# Patient Record
Sex: Female | Born: 1980 | Race: Black or African American | Hispanic: No | Marital: Single | Smoking: Never smoker
Health system: Southern US, Community
[De-identification: ages and names within clinical notes are randomized; demographics above are authoritative.]

## PROBLEM LIST (undated history)

## (undated) DIAGNOSIS — L732 Hidradenitis suppurativa: Secondary | ICD-10-CM

## (undated) DIAGNOSIS — D509 Iron deficiency anemia, unspecified: Secondary | ICD-10-CM

## (undated) DIAGNOSIS — Z8709 Personal history of other diseases of the respiratory system: Secondary | ICD-10-CM

## (undated) HISTORY — DX: Iron deficiency anemia, unspecified: D50.9

## (undated) HISTORY — DX: Personal history of other diseases of the respiratory system: Z87.09

## (undated) HISTORY — PX: SKIN SURGERY: SHX2413

## (undated) HISTORY — PX: UTERINE FIBROID SURGERY: SHX826

---

## 1998-01-30 ENCOUNTER — Encounter: Payer: Self-pay | Admitting: Emergency Medicine

## 1998-01-30 ENCOUNTER — Emergency Department (HOSPITAL_COMMUNITY): Admission: EM | Admit: 1998-01-30 | Discharge: 1998-01-30 | Payer: Self-pay | Admitting: Emergency Medicine

## 1999-08-26 ENCOUNTER — Emergency Department (HOSPITAL_COMMUNITY): Admission: EM | Admit: 1999-08-26 | Discharge: 1999-08-26 | Payer: Self-pay | Admitting: Emergency Medicine

## 2000-06-29 ENCOUNTER — Emergency Department (HOSPITAL_COMMUNITY): Admission: EM | Admit: 2000-06-29 | Discharge: 2000-06-29 | Payer: Self-pay | Admitting: Emergency Medicine

## 2000-12-06 ENCOUNTER — Emergency Department (HOSPITAL_COMMUNITY): Admission: EM | Admit: 2000-12-06 | Discharge: 2000-12-06 | Payer: Self-pay | Admitting: Emergency Medicine

## 2002-01-04 ENCOUNTER — Encounter: Payer: Self-pay | Admitting: Emergency Medicine

## 2002-01-04 ENCOUNTER — Emergency Department (HOSPITAL_COMMUNITY): Admission: EM | Admit: 2002-01-04 | Discharge: 2002-01-04 | Payer: Self-pay | Admitting: Emergency Medicine

## 2002-01-11 ENCOUNTER — Encounter: Payer: Self-pay | Admitting: Emergency Medicine

## 2002-01-11 ENCOUNTER — Emergency Department (HOSPITAL_COMMUNITY): Admission: EM | Admit: 2002-01-11 | Discharge: 2002-01-11 | Payer: Self-pay | Admitting: Emergency Medicine

## 2003-01-28 ENCOUNTER — Emergency Department (HOSPITAL_COMMUNITY): Admission: EM | Admit: 2003-01-28 | Discharge: 2003-01-29 | Payer: Self-pay | Admitting: Emergency Medicine

## 2003-11-10 ENCOUNTER — Emergency Department (HOSPITAL_COMMUNITY): Admission: EM | Admit: 2003-11-10 | Discharge: 2003-11-10 | Payer: Self-pay | Admitting: *Deleted

## 2003-11-23 ENCOUNTER — Emergency Department (HOSPITAL_COMMUNITY): Admission: EM | Admit: 2003-11-23 | Discharge: 2003-11-23 | Payer: Self-pay | Admitting: Emergency Medicine

## 2004-04-08 ENCOUNTER — Emergency Department (HOSPITAL_COMMUNITY): Admission: EM | Admit: 2004-04-08 | Discharge: 2004-04-08 | Payer: Self-pay | Admitting: Emergency Medicine

## 2005-02-19 ENCOUNTER — Emergency Department (HOSPITAL_COMMUNITY): Admission: EM | Admit: 2005-02-19 | Discharge: 2005-02-19 | Payer: Self-pay | Admitting: Emergency Medicine

## 2008-08-20 ENCOUNTER — Emergency Department (HOSPITAL_COMMUNITY): Admission: EM | Admit: 2008-08-20 | Discharge: 2008-08-20 | Payer: Self-pay | Admitting: Emergency Medicine

## 2008-11-04 ENCOUNTER — Emergency Department (HOSPITAL_COMMUNITY): Admission: EM | Admit: 2008-11-04 | Discharge: 2008-11-04 | Payer: Self-pay | Admitting: Emergency Medicine

## 2009-01-17 ENCOUNTER — Emergency Department (HOSPITAL_COMMUNITY): Admission: EM | Admit: 2009-01-17 | Discharge: 2009-01-17 | Payer: Self-pay | Admitting: Emergency Medicine

## 2009-10-30 ENCOUNTER — Emergency Department (HOSPITAL_COMMUNITY): Admission: EM | Admit: 2009-10-30 | Discharge: 2009-10-30 | Payer: Self-pay | Admitting: Family Medicine

## 2009-11-21 ENCOUNTER — Emergency Department (HOSPITAL_COMMUNITY): Admission: EM | Admit: 2009-11-21 | Discharge: 2009-11-21 | Payer: Self-pay | Admitting: Emergency Medicine

## 2009-11-26 ENCOUNTER — Ambulatory Visit: Payer: Self-pay | Admitting: Obstetrics and Gynecology

## 2009-12-02 ENCOUNTER — Ambulatory Visit (HOSPITAL_COMMUNITY): Admission: RE | Admit: 2009-12-02 | Discharge: 2009-12-02 | Payer: Self-pay | Admitting: Obstetrics and Gynecology

## 2009-12-17 ENCOUNTER — Ambulatory Visit: Payer: Self-pay | Admitting: Obstetrics and Gynecology

## 2010-02-15 ENCOUNTER — Emergency Department (HOSPITAL_BASED_OUTPATIENT_CLINIC_OR_DEPARTMENT_OTHER)
Admission: EM | Admit: 2010-02-15 | Discharge: 2010-02-15 | Payer: Self-pay | Source: Home / Self Care | Admitting: Emergency Medicine

## 2010-03-08 ENCOUNTER — Ambulatory Visit
Admission: RE | Admit: 2010-03-08 | Discharge: 2010-03-08 | Payer: Self-pay | Source: Home / Self Care | Attending: Specialist | Admitting: Specialist

## 2010-03-15 LAB — DIFFERENTIAL
Basophils Absolute: 0 10*3/uL (ref 0.0–0.1)
Basophils Relative: 0 % (ref 0–1)
Eosinophils Absolute: 0 10*3/uL (ref 0.0–0.7)
Eosinophils Relative: 0 % (ref 0–5)
Lymphocytes Relative: 26 % (ref 12–46)
Lymphs Abs: 1.9 10*3/uL (ref 0.7–4.0)
Monocytes Absolute: 0.6 10*3/uL (ref 0.1–1.0)
Monocytes Relative: 8 % (ref 3–12)
Neutro Abs: 4.7 10*3/uL (ref 1.7–7.7)
Neutrophils Relative %: 65 % (ref 43–77)

## 2010-03-15 LAB — CBC
HCT: 33.8 % — ABNORMAL LOW (ref 36.0–46.0)
Hemoglobin: 11 g/dL — ABNORMAL LOW (ref 12.0–15.0)
MCH: 25.3 pg — ABNORMAL LOW (ref 26.0–34.0)
MCHC: 32.5 g/dL (ref 30.0–36.0)
MCV: 77.7 fL — ABNORMAL LOW (ref 78.0–100.0)
Platelets: 281 10*3/uL (ref 150–400)
RBC: 4.35 MIL/uL (ref 3.87–5.11)
RDW: 16 % — ABNORMAL HIGH (ref 11.5–15.5)
WBC: 7.2 10*3/uL (ref 4.0–10.5)

## 2010-03-15 LAB — POCT I-STAT, CHEM 8
BUN: 6 mg/dL (ref 6–23)
Calcium, Ion: 1.19 mmol/L (ref 1.12–1.32)
Chloride: 106 mEq/L (ref 96–112)
Creatinine, Ser: 0.8 mg/dL (ref 0.4–1.2)
Glucose, Bld: 94 mg/dL (ref 70–99)
HCT: 35 % — ABNORMAL LOW (ref 36.0–46.0)
Hemoglobin: 11.9 g/dL — ABNORMAL LOW (ref 12.0–15.0)
Potassium: 3.8 mEq/L (ref 3.5–5.1)
Sodium: 140 mEq/L (ref 135–145)
TCO2: 25 mmol/L (ref 0–100)

## 2010-05-13 ENCOUNTER — Other Ambulatory Visit: Payer: Self-pay | Admitting: Obstetrics and Gynecology

## 2010-05-13 DIAGNOSIS — N852 Hypertrophy of uterus: Secondary | ICD-10-CM

## 2010-05-13 LAB — URINALYSIS, ROUTINE W REFLEX MICROSCOPIC
Bilirubin Urine: NEGATIVE
Glucose, UA: NEGATIVE mg/dL
Ketones, ur: NEGATIVE mg/dL
Nitrite: NEGATIVE
Protein, ur: NEGATIVE mg/dL
Specific Gravity, Urine: 1.015 (ref 1.005–1.030)
Urobilinogen, UA: 0.2 mg/dL (ref 0.0–1.0)
pH: 7 (ref 5.0–8.0)

## 2010-05-13 LAB — WET PREP, GENITAL
Clue Cells Wet Prep HPF POC: NONE SEEN
Trich, Wet Prep: NONE SEEN
Yeast Wet Prep HPF POC: NONE SEEN

## 2010-05-13 LAB — HERPES SIMPLEX VIRUS CULTURE: Culture: NOT DETECTED

## 2010-05-13 LAB — POCT PREGNANCY, URINE: Preg Test, Ur: NEGATIVE

## 2010-05-13 LAB — URINE MICROSCOPIC-ADD ON

## 2010-05-13 LAB — POCT URINALYSIS DIPSTICK
Bilirubin Urine: NEGATIVE
Glucose, UA: NEGATIVE mg/dL
Ketones, ur: NEGATIVE mg/dL
Nitrite: NEGATIVE
Protein, ur: NEGATIVE mg/dL
Specific Gravity, Urine: 1.025 (ref 1.005–1.030)
Urobilinogen, UA: 0.2 mg/dL (ref 0.0–1.0)
pH: 6 (ref 5.0–8.0)

## 2010-05-13 LAB — GC/CHLAMYDIA PROBE AMP, GENITAL
Chlamydia, DNA Probe: NEGATIVE
GC Probe Amp, Genital: NEGATIVE

## 2010-05-15 ENCOUNTER — Ambulatory Visit
Admission: RE | Admit: 2010-05-15 | Discharge: 2010-05-15 | Disposition: A | Discharge status: Home / Self Care | Payer: BC Managed Care – PPO | Source: Ambulatory Visit | Attending: Obstetrics and Gynecology | Admitting: Obstetrics and Gynecology

## 2010-05-15 DIAGNOSIS — N852 Hypertrophy of uterus: Secondary | ICD-10-CM

## 2010-05-15 MED ORDER — GADOBENATE DIMEGLUMINE 529 MG/ML IV SOLN
10.0000 mL | Freq: Once | INTRAVENOUS | Status: AC | PRN
  Administered 2010-05-15: 10 mL via INTRAVENOUS

## 2010-08-04 ENCOUNTER — Emergency Department (HOSPITAL_BASED_OUTPATIENT_CLINIC_OR_DEPARTMENT_OTHER)
Admission: EM | Admit: 2010-08-04 | Discharge: 2010-08-04 | Disposition: A | Discharge status: Home / Self Care | Payer: BC Managed Care – PPO | Attending: Emergency Medicine | Admitting: Emergency Medicine

## 2010-08-04 DIAGNOSIS — J3489 Other specified disorders of nose and nasal sinuses: Secondary | ICD-10-CM | POA: Insufficient documentation

## 2010-08-04 DIAGNOSIS — J069 Acute upper respiratory infection, unspecified: Secondary | ICD-10-CM | POA: Insufficient documentation

## 2010-08-05 ENCOUNTER — Other Ambulatory Visit (HOSPITAL_COMMUNITY): Payer: BC Managed Care – PPO

## 2010-08-05 ENCOUNTER — Other Ambulatory Visit: Payer: Self-pay | Admitting: Obstetrics and Gynecology

## 2010-08-05 ENCOUNTER — Encounter (HOSPITAL_COMMUNITY): Payer: BC Managed Care – PPO

## 2010-08-05 LAB — CBC
HCT: 31 % — ABNORMAL LOW (ref 36.0–46.0)
Hemoglobin: 10.1 g/dL — ABNORMAL LOW (ref 12.0–15.0)
MCH: 24.7 pg — ABNORMAL LOW (ref 26.0–34.0)
MCHC: 32.6 g/dL (ref 30.0–36.0)
MCV: 75.8 fL — ABNORMAL LOW (ref 78.0–100.0)
Platelets: 229 10*3/uL (ref 150–400)
RBC: 4.09 MIL/uL (ref 3.87–5.11)
RDW: 16.6 % — ABNORMAL HIGH (ref 11.5–15.5)
WBC: 9.1 10*3/uL (ref 4.0–10.5)

## 2010-08-12 ENCOUNTER — Inpatient Hospital Stay (HOSPITAL_COMMUNITY)
Admission: RE | Admit: 2010-08-12 | Discharge: 2010-08-14 | DRG: 359 | Disposition: A | Discharge status: Home / Self Care | Payer: BC Managed Care – PPO | Source: Ambulatory Visit | Attending: Obstetrics and Gynecology | Admitting: Obstetrics and Gynecology

## 2010-08-12 ENCOUNTER — Other Ambulatory Visit: Payer: Self-pay | Admitting: Obstetrics and Gynecology

## 2010-08-12 DIAGNOSIS — L732 Hidradenitis suppurativa: Secondary | ICD-10-CM | POA: Diagnosis present

## 2010-08-12 DIAGNOSIS — N920 Excessive and frequent menstruation with regular cycle: Principal | ICD-10-CM | POA: Diagnosis present

## 2010-08-12 DIAGNOSIS — D509 Iron deficiency anemia, unspecified: Secondary | ICD-10-CM | POA: Diagnosis present

## 2010-08-12 DIAGNOSIS — Z01812 Encounter for preprocedural laboratory examination: Secondary | ICD-10-CM

## 2010-08-12 DIAGNOSIS — N909 Noninflammatory disorder of vulva and perineum, unspecified: Secondary | ICD-10-CM | POA: Diagnosis present

## 2010-08-12 DIAGNOSIS — D259 Leiomyoma of uterus, unspecified: Secondary | ICD-10-CM | POA: Diagnosis present

## 2010-08-12 DIAGNOSIS — Z01818 Encounter for other preprocedural examination: Secondary | ICD-10-CM

## 2010-08-12 LAB — TYPE AND SCREEN
ABO/RH(D): A POS
Antibody Screen: NEGATIVE

## 2010-08-12 LAB — ABO/RH: ABO/RH(D): A POS

## 2010-08-12 LAB — PREGNANCY, URINE: Preg Test, Ur: NEGATIVE

## 2010-08-12 LAB — SURGICAL PCR SCREEN: Staphylococcus aureus: NEGATIVE

## 2010-08-13 LAB — CBC
Platelets: 253 10*3/uL (ref 150–400)
RDW: 16.4 % — ABNORMAL HIGH (ref 11.5–15.5)
WBC: 12.4 10*3/uL — ABNORMAL HIGH (ref 4.0–10.5)

## 2010-08-27 NOTE — Discharge Summary (Signed)
  NAMEBRIGITTA, [name] NO.:  [id number]  MEDICAL RECORD NO.:  [id number]  LOCATION:  9307                          FACILITY:  [facility]  PHYSICIAN:  Maxie Better, M.D.DATE OF BIRTH:  03/18/1980  DATE OF ADMISSION:  08/12/2010 DATE OF DISCHARGE:  08/14/2010                              DISCHARGE SUMMARY   ADMISSION DIAGNOSES:  Menorrhagia; uterine fibroids; infected vulvar lesion; anemia, iron deficiency.  DISCHARGE DIAGNOSES:  Menorrhagia; iron-deficiency anemia; uterine fibroids; infected vulvar lesion, hidradenitis suppurativa  PROCEDURES:  Exploratory laparotomy, myomectomy, excision of infected vulvar lesion.  HISTORY OF PRESENT ILLNESS:  A 30 year old G0 single black female with symptomatic uterine fibroids as well as hidradenitis suppurativa on the mons who presents for surgical management.  HOSPITAL COURSE:  The patient was admitted to [facility].  She was taken to the operating room where she underwent the above procedures. Postoperative, the patient did well.  Her CBC on postop day #1 showed hemoglobin of 8.5, hematocrit of 26.6, white count of 12.4, platelet counts of 253,000.  The patient was asymptomatic.  By postoperative day #2, the patient was passing flatus.  She has been remained in afebrile. Incision had no erythema, induration or exudate.  The mons incision had slight edema of the right labium majora but otherwise unremarkable. The patient was deemed well to be discharge home.  Disposition is home.  Condition is stable.  DISCHARGE MEDICATIONS: 1. Nu-Iron 150 mg 1 p.o. b.i.d. #60. 2. Motrin 800 mg every 6-8 hours p.r.n. #30. 3. Dilaudid 2 mg 1-2 tablets every 3-4 hours p.r.n. pain.  DISCHARGE INSTRUCTIONS:  Call for temperature greater than or equal to 100.4, nothing per vagina in 4-6 weeks, no heavy lifting or driving for 2 weeks.  Call for increased incisional pain, drainage or redness on the incision site, severe nausea or  vomiting.  Warm heart compresses to the mons area.  If it is draining as well and erythematous, need to call. Followup appointment in 2 weeks for suture removal in mons area and 4-6 weeks for the regular postop appointments.     Maxie Better, M.D.    [place]/MEDQ  D:  08/14/2010  T:  08/14/2010  Job:  [id number]  Electronically Signed by Nena Jordan Nilsa Macht M.D. on 08/27/2010 04:14:01 AM

## 2010-08-27 NOTE — Op Note (Signed)
[name], FANO NO.:  [id number]  MEDICAL RECORD NO.:  [id number]  LOCATION:  9307                          [facility]ILITY:  [facility]  PHYSICIAN:  Maxie Better, M.D.DATE OF BIRTH:  1980/12/31  DATE OF PROCEDURE:  08/12/2010 DATE OF DISCHARGE:                              OPERATIVE REPORT   PREOPERATIVE DIAGNOSES:  Menorrhagia, uterine fibroids, anemia, infected vulvar lesion.  POSTOPERATIVE DIAGNOSES:  Menorrhagia, uterine fibroids, anemia, infected vulvar lesion.  PROCEDURES:  Exploratory laparotomy, myomectomy, excision of infected vulvar lesion.  ANESTHESIA:  General.  SURGEON.:  Maxie Better, MD  ASSESSMENT:  Alice Del, MD  PROCEDURE IN DETAIL:  Under adequate general anesthesia, the patient was placed in the dorsal lithotomy position.  She was sterilely prepped and draped in usual fashion including the vulvar area and vagina.  An indwelling Foley catheter was sterilely placed.  0.25% Marcaine was injected along the planned Pfannenstiel skin incision site. Pfannenstiel skin incision was then made, carried down to the rectus fascia.  Rectus fascia was opened transversely.  The rectus fascia was then bluntly and sharply dissected off the rectus muscle in superior and inferior fashion.  The rectus muscle was split in midline.  The parietal peritoneum was entered bluntly and extended.  On entering the abdominal cavity, the fibroid uterus noted with somewhat rotated to the patient's left.  A large posterior fundal fibroid was noted.  A dilute solution of Pitressin was injected in the fundal area, and a single-tooth tenaculum used to grasp that fibroid and bring the uterus out of the pelvis.  Both tubes and ovaries were noted to be normal.  There was a fibroid pedunculated on the left anterior, which had wrapped around to the posterior cul-de-sac and that was freed easily.  There was a small lower uterine segment fibroid about 3 cm and a  smaller subserosal fibroid. Using Pitressin in a vertical fashion, the posterior fibroid was enucleated ultimately from its base and then which was very close to the endometrium.  The deep spaces were closed with 0 Vicryl figure-of-eight sutures till the serosal surface was reached at which time 3-0 Vicryl and a baseball stitch was used to close the serosal surface.  The other fibroids were removed in a similar fashion including the pedunculated fibroid and then closed with deeper layers of 0 Vicryl, and the surface with Monocryl suture.  Upper abdomen was explored, normal liver edge, normal palpable kidney.  Appendix was not seen.  The abdomen was then copiously irrigated and suctioned.  The interceed was placed in the anterior and posterior aspect overlying the incisions.  The uterus was placed back in the abdomen.  The parietal peritoneum was then closed with 2-0 Vicryl, the rectus fascia with 0 Vicryl x2.  The subcutaneous area was irrigated and small bleeders cauterized, interrupted 2-0 plain sutures placed, and the skin approximated using 4-0 Monocryl subcuticular closure.  Attention was then turned to the vulva.  There was a purulent opened sinus tract hidradenitis lesion, 1% Nesacaine was injected overlying the lesion, which was indurated and hard at the base. The lesion was then opened and the induration removed, increased bleeding was noted.  The incision was further opened to control bleeding,  bleeders were cauterized, and then 2-0 Vicryl interrupted figure-of-eight sutures was placed in the deep base.  The skin was approximated with interrupted 4-0 Vicryl with good hemostasis noted. Once this was done, the procedure was felt to have incomplete.  The patient tolerated the procedure well, was transferred to recovery in stable condition.  SPECIMEN:  The indurated tissue from the vulva sent as well as myoma x4 sent to Pathology.  ESTIMATED BLOOD LOSS:  175 mL  total.  INTRAOPERATIVE FLUID:  1400 mL.  URINE OUTPUT:  150 mL clear yellow, but concentrated urine.  COUNT:  Sponge and instrument counts x2 was correct.  COMPLICATIONS:  None.     Maxie Better, M.D.     [place]/MEDQ  D:  08/12/2010  T:  08/13/2010  Job:  [id number]  Electronically Signed by Nena Jordan Maralee Higuchi M.D. on 08/27/2010 04:11:48 AM

## 2012-01-22 ENCOUNTER — Emergency Department (HOSPITAL_BASED_OUTPATIENT_CLINIC_OR_DEPARTMENT_OTHER)
Admission: EM | Admit: 2012-01-22 | Discharge: 2012-01-22 | Disposition: A | Discharge status: Home / Self Care | Payer: Worker's Compensation | Attending: Emergency Medicine | Admitting: Emergency Medicine

## 2012-01-22 ENCOUNTER — Emergency Department (HOSPITAL_BASED_OUTPATIENT_CLINIC_OR_DEPARTMENT_OTHER): Payer: Worker's Compensation

## 2012-01-22 ENCOUNTER — Encounter (HOSPITAL_BASED_OUTPATIENT_CLINIC_OR_DEPARTMENT_OTHER): Payer: Self-pay | Admitting: Emergency Medicine

## 2012-01-22 DIAGNOSIS — Y9389 Activity, other specified: Secondary | ICD-10-CM | POA: Insufficient documentation

## 2012-01-22 DIAGNOSIS — S29011A Strain of muscle and tendon of front wall of thorax, initial encounter: Secondary | ICD-10-CM

## 2012-01-22 DIAGNOSIS — S2341XA Sprain of ribs, initial encounter: Secondary | ICD-10-CM | POA: Insufficient documentation

## 2012-01-22 DIAGNOSIS — Y9229 Other specified public building as the place of occurrence of the external cause: Secondary | ICD-10-CM | POA: Insufficient documentation

## 2012-01-22 DIAGNOSIS — IMO0002 Reserved for concepts with insufficient information to code with codable children: Secondary | ICD-10-CM | POA: Insufficient documentation

## 2012-01-22 DIAGNOSIS — Y990 Civilian activity done for income or pay: Secondary | ICD-10-CM | POA: Insufficient documentation

## 2012-01-22 HISTORY — DX: Hidradenitis suppurativa: L73.2

## 2012-01-22 MED ORDER — IBUPROFEN 800 MG PO TABS: 800.0000 mg | ORAL_TABLET | Freq: Three times a day (TID) | ORAL | Status: DC | PRN

## 2012-01-22 MED ORDER — OXYCODONE-ACETAMINOPHEN 5-325 MG PO TABS: 1.0000 | ORAL_TABLET | Freq: Four times a day (QID) | ORAL | Status: DC | PRN

## 2012-01-22 MED ORDER — IBUPROFEN 800 MG PO TABS
800.0000 mg | ORAL_TABLET | Freq: Once | ORAL | Status: AC
  Administered 2012-01-22: 800 mg via ORAL
  Filled 2012-01-22: qty 1

## 2012-01-22 MED ORDER — OXYCODONE-ACETAMINOPHEN 5-325 MG PO TABS
1.0000 | ORAL_TABLET | Freq: Once | ORAL | Status: AC
  Administered 2012-01-22: 1 via ORAL
  Filled 2012-01-22 (×2): qty 1

## 2012-01-22 NOTE — ED Notes (Signed)
Pt states hydrocodone makes her itch-she has taken "percocet" w/o reaction

## 2012-01-22 NOTE — ED Notes (Signed)
Pt was at work, Economist from one area to another, pulled left rib area.  Pt c/o rib area pain.  Unable to take deep breath.  Lungs clear.  O2 sat 100% on room air.

## 2012-01-22 NOTE — ED Notes (Signed)
Pt with multiple ?s r/t to disposition and RTW-EDP Alice Tucker back in to talk with pt

## 2012-01-22 NOTE — ED Provider Notes (Addendum)
History     CSN: [id number]  Arrival date & time 01/22/12  1344   First MD Initiated Contact with Patient 01/22/12 1401      Chief Complaint  Patient presents with  . Rib Injury    (Consider location/radiation/quality/duration/timing/severity/associated sxs/prior treatment) HPI Pt reports she was at work [place] the [employer] [place] when she felt a sudden, sharp pain in L chest wall, severe sharp and worse with deep breath and movement. No falls or direct trauma.   No past medical history on file.  No past surgical history on file.  No family history on file.  History  Substance Use Topics  . Smoking status: Not on file  . Smokeless tobacco: Not on file  . Alcohol Use: Not on file    OB History    Grav Para Term Preterm Abortions TAB SAB Ect Mult Living                  Review of Systems All other systems reviewed and are negative except as noted in HPI.   Allergies  Hydrocodone; Penicillins; and Sulfa antibiotics  Home Medications  No current outpatient prescriptions on file.  BP 129/73  Pulse 95  Temp 97.7 F (36.5 C) (Oral)  Resp 18  SpO2 100%  LMP 12/24/2011  Physical Exam  Nursing note and vitals reviewed. Constitutional: She is oriented to person, place, and time. She appears well-developed and well-nourished.  HENT:  Head: Normocephalic and atraumatic.  Eyes: EOM are normal. Pupils are equal, round, and reactive to light.  Neck: Normal range of motion. Neck supple.  Cardiovascular: Normal rate, normal heart sounds and intact distal pulses.   Pulmonary/Chest: Effort normal and breath sounds normal. She has no wheezes. She has no rales. She exhibits tenderness (L lateral chest wall tenderness).  Abdominal: Bowel sounds are normal. She exhibits no distension. There is no tenderness.  Musculoskeletal: Normal range of motion. She exhibits no edema and no tenderness.  Neurological: She is alert and oriented to person,  place, and time. She has normal strength. No cranial nerve deficit or sensory deficit.  Skin: Skin is warm and dry. No rash noted.  Psychiatric: She has a normal mood and affect.    ED Course  Procedures (including critical care time)  Labs Reviewed - No data to display Dg Ribs Unilateral W/chest Left  01/22/2012  *RADIOLOGY REPORT*  Clinical Data:  rib injury, pain  LEFT RIBS AND CHEST - 3+ VIEW  Comparison: 02/19/2005  Findings: Normal heart size and vascularity.  Lungs clear.  No focal pneumonia, collapse, consolidation, edema, effusion or pneumothorax.  No free air.  Left ribs appear intact.  No definite displaced fracture or focal rib abnormality.  IMPRESSION: No acute finding.   Original Report Authenticated By: Judie Petit. Miles Costain, M.D.      No diagnosis found.    MDM  Xray neg, chest wall pain due to muscle strain. Will d/c with NSAIDs, pain meds and rest.   3:36 PM Pt not satisfied with my initial explanation of findings so went back to the room to discuss in more detail.  She is also upset that I did not fill out the forms sent by her employer to her satisfaction. I had initially limited her lifting to 10lbs because this was the lowest weight listed on the form, patient states she cannot do any lifting, so form was amended to reflect same. Advised that the ED cannot provide occupational medicine services  and that she would need to followup with her PCP or worker's comp doctor for further evaluation and clearance to return to work. She is upset about continued pain and states she is going to [facility] or [facility] for a second opinion. Advised that they would be unlikely to have any additional workup to offer but she is insistent. She was shown her xray at her request.      Leonette Most B. Bernette Mayers, MD 01/22/12 1540

## 2012-08-07 DIAGNOSIS — L732 Hidradenitis suppurativa: Secondary | ICD-10-CM | POA: Insufficient documentation

## 2014-04-29 LAB — HM PAP SMEAR: HM Pap smear: NORMAL

## 2014-06-25 IMAGING — CR DG RIBS W/ CHEST 3+V*L*
4 series · 4 of 4 positions shown · non-contrast
Comparison: 02/19/2005

CLINICAL DATA: rib injury, pain

LEFT RIBS AND CHEST - 3+ VIEW

[w chest pa]
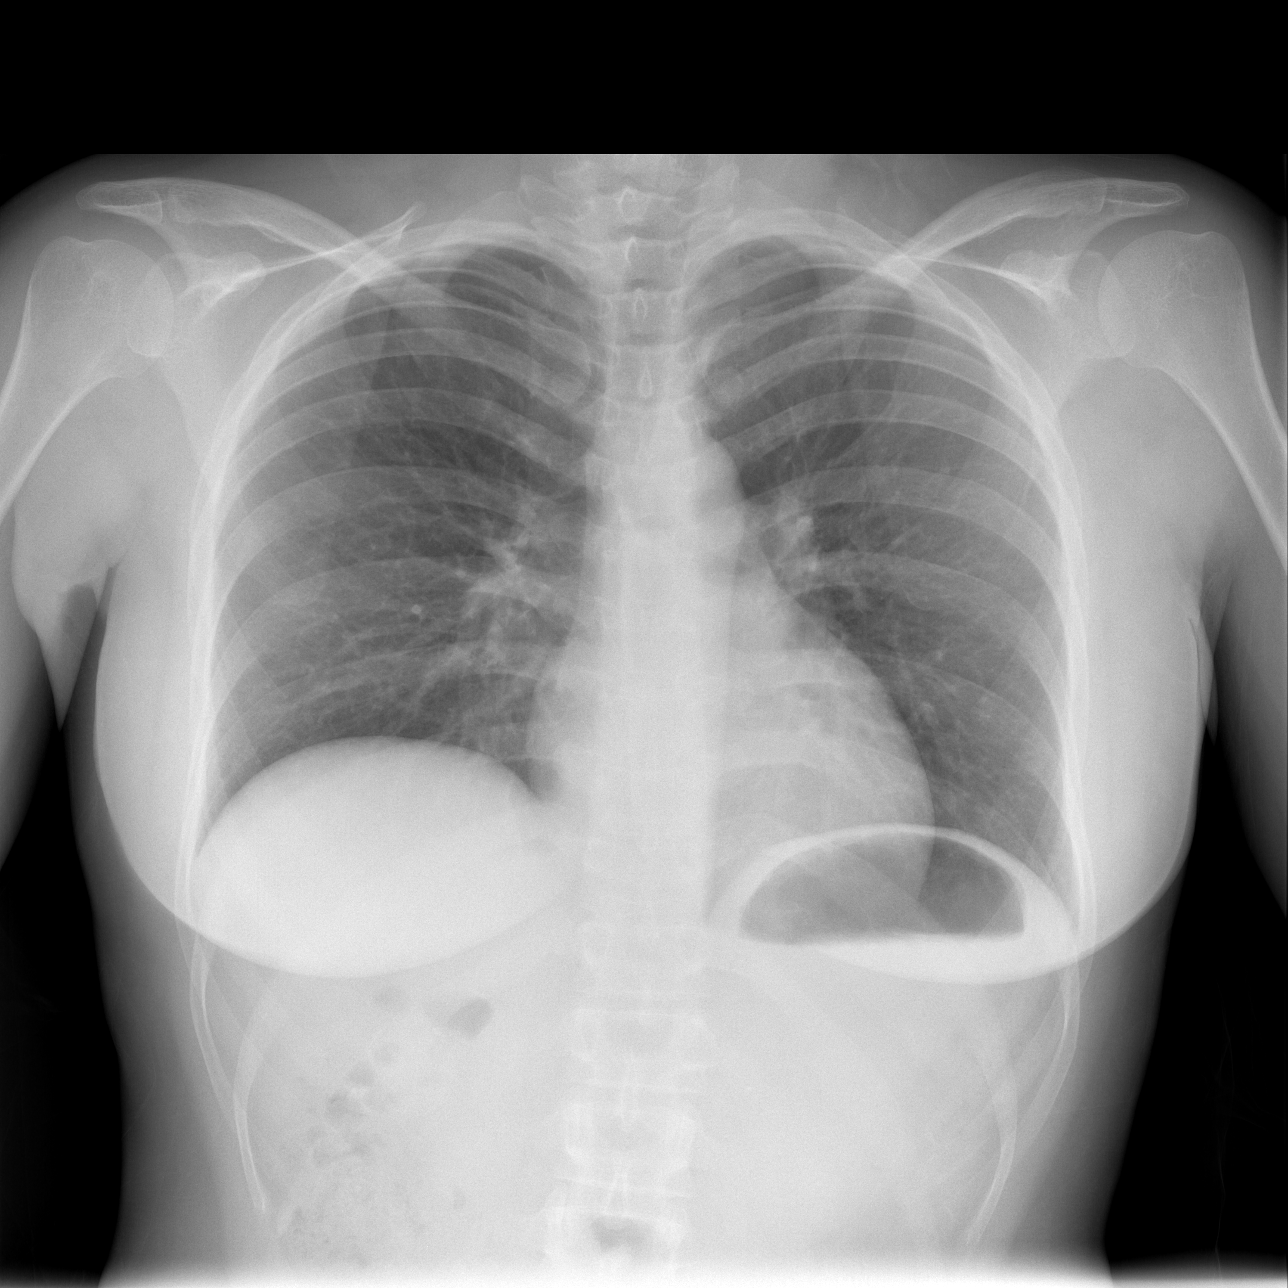

[w ribs ap/pa upper left]
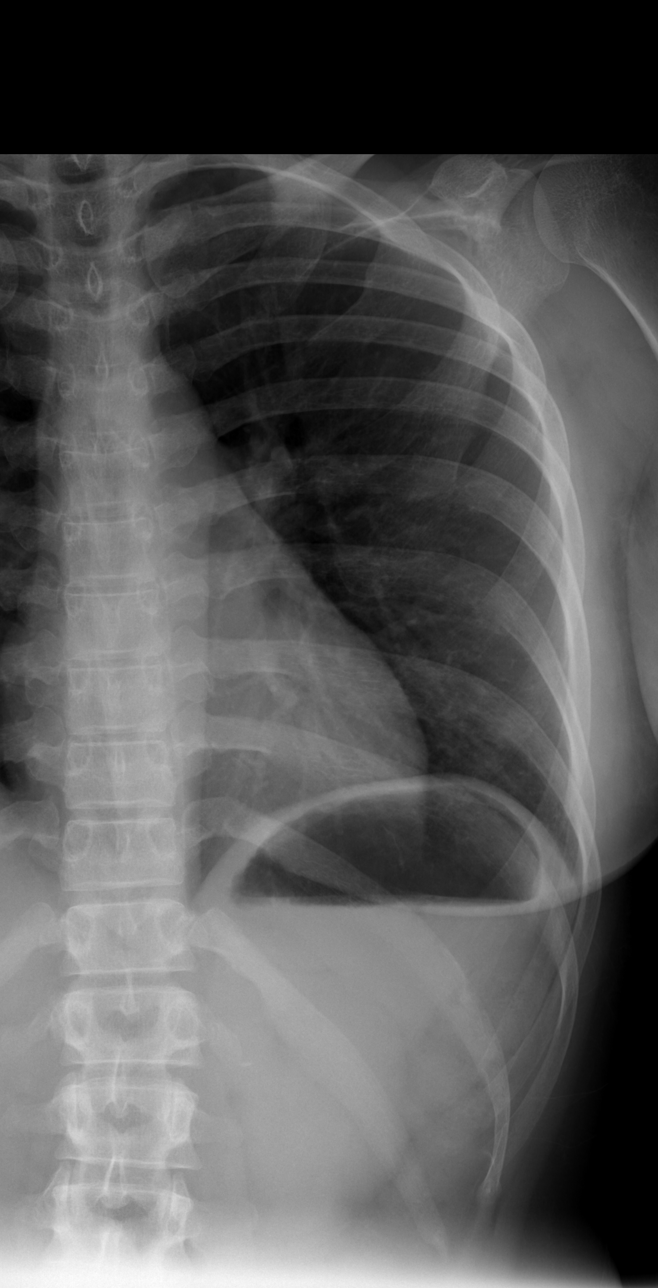

[w ribs ap/pa lower left]
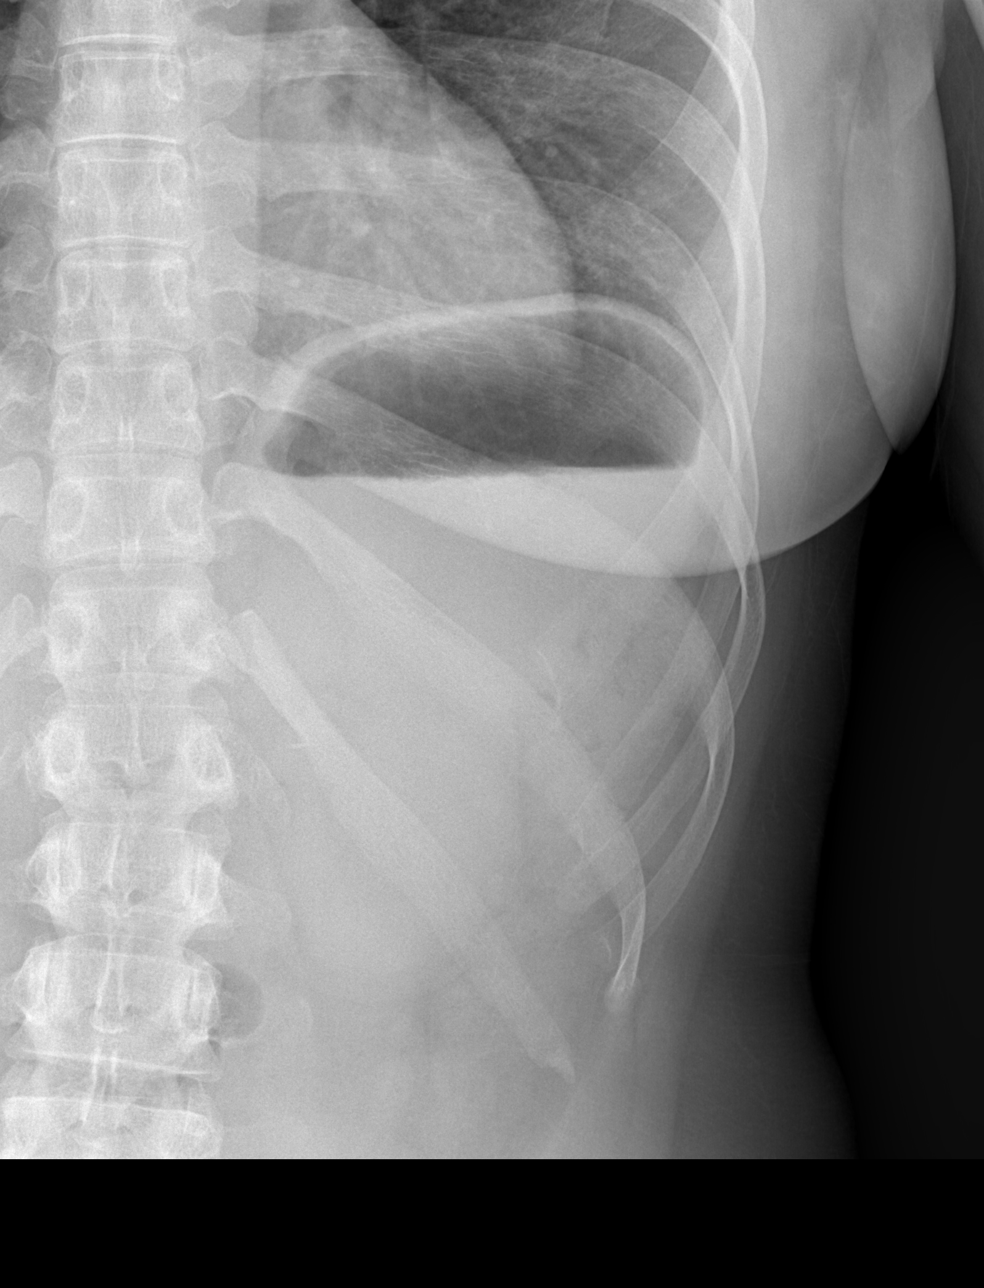

[w ribs oblique left]
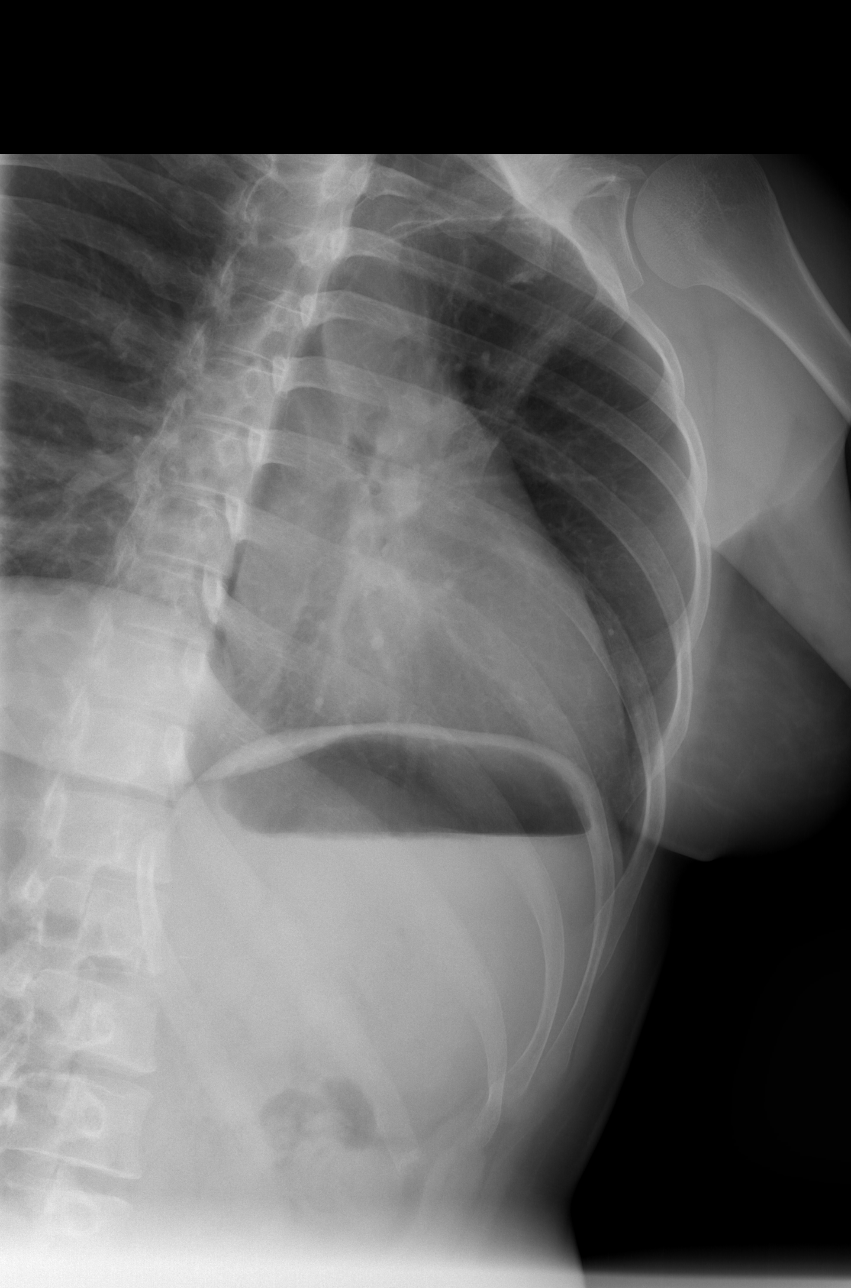

[4 of 4 positions shown; findings below may reference images not displayed]

FINDINGS: Normal heart size and vascularity.  Lungs clear.  No
focal pneumonia, collapse, consolidation, edema, effusion or
pneumothorax.  No free air.  Left ribs appear intact.  No definite
displaced fracture or focal rib abnormality.
IMPRESSION: No acute finding.

## 2014-07-31 ENCOUNTER — Ambulatory Visit: Payer: Self-pay | Admitting: Dietician

## 2014-09-22 ENCOUNTER — Ambulatory Visit: Payer: Self-pay | Admitting: *Deleted

## 2014-10-22 ENCOUNTER — Ambulatory Visit: Payer: Self-pay | Admitting: Dietician

## 2014-12-08 ENCOUNTER — Ambulatory Visit: Payer: Self-pay | Admitting: Dietician

## 2015-03-16 ENCOUNTER — Telehealth: Payer: Self-pay | Admitting: Physician Assistant

## 2015-03-16 ENCOUNTER — Ambulatory Visit: Payer: Worker's Compensation | Admitting: Physician Assistant

## 2015-03-16 NOTE — Telephone Encounter (Signed)
No charge this time. 

## 2015-03-16 NOTE — Telephone Encounter (Signed)
Pt was no show today 7:30am, pt left VM 1/16 6:33am that she was not able to make it to appt today, new pt appt, pt rescheduled for 04/03/15 8:30am, I informed pt of 24 hr cancellation policy for future reference, charge or no charge?

## 2015-03-24 ENCOUNTER — Encounter: Payer: Self-pay | Admitting: Physician Assistant

## 2015-04-02 ENCOUNTER — Telehealth: Payer: Self-pay | Admitting: *Deleted

## 2015-04-02 NOTE — Telephone Encounter (Signed)
Unable to reach patient at time of pre-visit call. Left message for patient to return call when available.  

## 2015-04-03 ENCOUNTER — Ambulatory Visit (INDEPENDENT_AMBULATORY_CARE_PROVIDER_SITE_OTHER): Payer: BLUE CROSS/BLUE SHIELD | Admitting: Physician Assistant

## 2015-04-03 ENCOUNTER — Encounter: Payer: Self-pay | Admitting: Physician Assistant

## 2015-04-03 VITALS — BP 148/86 | HR 79 | Temp 98.0°F | Ht 65.0 in | Wt 186.0 lb

## 2015-04-03 DIAGNOSIS — R030 Elevated blood-pressure reading, without diagnosis of hypertension: Secondary | ICD-10-CM | POA: Diagnosis not present

## 2015-04-03 DIAGNOSIS — R0981 Nasal congestion: Secondary | ICD-10-CM | POA: Diagnosis not present

## 2015-04-03 DIAGNOSIS — IMO0001 Reserved for inherently not codable concepts without codable children: Secondary | ICD-10-CM

## 2015-04-03 DIAGNOSIS — L732 Hidradenitis suppurativa: Secondary | ICD-10-CM | POA: Diagnosis not present

## 2015-04-03 MED ORDER — FLUTICASONE PROPIONATE 50 MCG/ACT NA SUSP: 2.0000 | Freq: Every day | NASAL | Status: DC

## 2015-04-03 MED ORDER — MELOXICAM 15 MG PO TABS: 15.0000 mg | ORAL_TABLET | Freq: Every day | ORAL | Status: DC

## 2015-04-03 MED ORDER — DOXYCYCLINE HYCLATE 100 MG PO CAPS: 100.0000 mg | ORAL_CAPSULE | Freq: Two times a day (BID) | ORAL | Status: DC

## 2015-04-03 NOTE — Progress Notes (Signed)
Pre visit review using our clinic review tool, if applicable. No additional management support is needed unless otherwise documented below in the visit note. 

## 2015-04-03 NOTE — Patient Instructions (Signed)
Please take the antibiotic as directed.   Use the Meloxicam once daily for pain and inflammation. ES Tylenol for breakthrough pain.  You will be contacted by General Surgery for assessment.  For nasal congestion -- Take a daily Claritin. Use the Flonase as directed.  Please follow the diet regimen below. We will recheck your BP at  Follow-up in 2 weeks.  DASH Eating Plan DASH stands for "Dietary Approaches to Stop Hypertension." The DASH eating plan is a healthy eating plan that has been shown to reduce high blood pressure (hypertension). Additional health benefits may include reducing the risk of type 2 diabetes mellitus, heart disease, and stroke. The DASH eating plan may also help with weight loss. WHAT DO I NEED TO KNOW ABOUT THE DASH EATING PLAN? For the DASH eating plan, you will follow these general guidelines:  Choose foods with a percent daily value for sodium of less than 5% (as listed on the food label).  Use salt-free seasonings or herbs instead of table salt or sea salt.  Check with your health care provider or pharmacist before using salt substitutes.  Eat lower-sodium products, often labeled as "lower sodium" or "no salt added."  Eat fresh foods.  Eat more vegetables, fruits, and low-fat dairy products.  Choose whole grains. Look for the word "whole" as the first word in the ingredient list.  Choose fish and skinless chicken or [place] more often than red meat. Limit fish, poultry, and meat to 6 oz (170 g) each day.  Limit sweets, desserts, sugars, and sugary drinks.  Choose heart-healthy fats.  Limit cheese to 1 oz (28 g) per day.  Eat more home-cooked food and less restaurant, buffet, and fast food.  Limit fried foods.  Cook foods using methods other than frying.  Limit canned vegetables. If you do use them, rinse them well to decrease the sodium.  When eating at a restaurant, ask that your food be prepared with less salt, or no salt if possible. WHAT  FOODS CAN I EAT? Seek help from a dietitian for individual calorie needs. Grains Whole grain or whole wheat bread. Brown rice. Whole grain or whole wheat pasta. Quinoa, bulgur, and whole grain cereals. Low-sodium cereals. Corn or whole wheat flour tortillas. Whole grain cornbread. Whole grain crackers. Low-sodium crackers. Vegetables Fresh or frozen vegetables (raw, steamed, roasted, or grilled). Low-sodium or reduced-sodium tomato and vegetable juices. Low-sodium or reduced-sodium tomato sauce and paste. Low-sodium or reduced-sodium canned vegetables.  Fruits All fresh, canned (in natural juice), or frozen fruits. Meat and Other Protein Products Ground beef (85% or leaner), grass-fed beef, or beef trimmed of fat. Skinless chicken or [place]. Ground chicken or [place]. Pork trimmed of fat. All fish and seafood. Eggs. Dried beans, peas, or lentils. Unsalted nuts and seeds. Unsalted canned beans. Dairy Low-fat dairy products, such as skim or 1% milk, 2% or reduced-fat cheeses, low-fat ricotta or cottage cheese, or plain low-fat yogurt. Low-sodium or reduced-sodium cheeses. Fats and Oils Tub margarines without trans fats. Light or reduced-fat mayonnaise and salad dressings (reduced sodium). Avocado. Safflower, olive, or canola oils. Natural peanut or almond butter. Other Unsalted popcorn and pretzels. The items listed above may not be a complete list of recommended foods or beverages. Contact your dietitian for more options. WHAT FOODS ARE NOT RECOMMENDED? Grains White bread. White pasta. White rice. Refined cornbread. Bagels and croissants. Crackers that contain trans fat. Vegetables Creamed or fried vegetables. Vegetables in a cheese sauce. Regular canned vegetables. Regular canned tomato sauce and paste.  Regular tomato and vegetable juices. Fruits Dried fruits. Canned fruit in light or heavy syrup. Fruit juice. Meat and Other Protein Products Fatty cuts of meat. Ribs, chicken wings, bacon,  sausage, bologna, salami, chitterlings, fatback, hot dogs, bratwurst, and packaged luncheon meats. Salted nuts and seeds. Canned beans with salt. Dairy Whole or 2% milk, cream, half-and-half, and cream cheese. Whole-fat or sweetened yogurt. Full-fat cheeses or blue cheese. Nondairy creamers and whipped toppings. Processed cheese, cheese spreads, or cheese curds. Condiments Onion and garlic salt, seasoned salt, table salt, and sea salt. Canned and packaged gravies. Worcestershire sauce. Tartar sauce. Barbecue sauce. Teriyaki sauce. Soy sauce, including reduced sodium. Steak sauce. Fish sauce. Oyster sauce. Cocktail sauce. Horseradish. Ketchup and mustard. Meat flavorings and tenderizers. Bouillon cubes. Hot sauce. Tabasco sauce. Marinades. Taco seasonings. Relishes. Fats and Oils Butter, stick margarine, lard, shortening, ghee, and bacon fat. Coconut, palm kernel, or palm oils. Regular salad dressings. Other Pickles and olives. Salted popcorn and pretzels. The items listed above may not be a complete list of foods and beverages to avoid. Contact your dietitian for more information. WHERE CAN I FIND MORE INFORMATION? National Heart, Lung, and Blood Institute: travelstabloid.com   This information is not intended to replace advice given to you by your health care provider. Make sure you discuss any questions you have with your health care provider.   Document Released: 02/03/2011 Document Revised: 03/07/2014 Document Reviewed: 12/19/2012 Elsevier Interactive Patient Education [employer].

## 2015-04-03 NOTE — Assessment & Plan Note (Signed)
Rx Doxycycline. Mobic for pain and swelling. Supportive measures reviewed. Referral to General surgery placed. Follow-up scheduled. Return sooner or ER if anything worsens.

## 2015-04-03 NOTE — Progress Notes (Signed)
Patient presents to clinic today to establish care.   Patient with documented history of hidradenitis suppurativa. Endorses symptom onset at 32.  Has had several surgeries for tracking abscesses. HAs had surgery to remove some of the sweat glands. Endorses several areas around the groin and buttock.  Denies axillary symptoms at present. Denies fever, chills, nausea or vomiting.   Patient with several months of nasal congestion with drainage. Denies sinus pain, ear pain, tooth pain. Denies fever, chills. Patient has been taking Mucinex to help with symptoms.  Endorses significant history of allergies and allergic asthma.   Patient also notes elevated BP since flare of symptoms. Has had history of intermittent high BP. Patient denies chest pain, palpitations, lightheadedness, dizziness, vision changes or frequent headaches.   Past Medical History  Diagnosis Date  . Hidradenitis suppurativa     No current outpatient prescriptions on file prior to visit.   No current facility-administered medications on file prior to visit.    Allergies  Allergen Reactions  . Hydrocodone   . Penicillins   . Sulfa Antibiotics   . Oxycodone-Acetaminophen Itching    Family History  Problem Relation Age of Onset  . Hypertension Mother   . Cancer Mother 42    Ovary/Uterus  . Hypertension Father   . Thyroid disease Paternal Uncle   . Diabetes Maternal Grandmother   . Cancer Paternal Grandmother     Breast  . Cancer Paternal Grandfather     Prostate    Social History   Social History  . Marital Status: Single    Spouse Name: N/A  . Number of Children: N/A  . Years of Education: N/A   Social History Main Topics  . Smoking status: Never Smoker   . Smokeless tobacco: Never Used  . Alcohol Use: 0.0 oz/week    0 Standard drinks or equivalent per week     Comment: Wine-occo.  . Drug Use: No  . Sexual Activity: Not Asked   Other Topics Concern  . None   Social History Narrative    Review of Systems  Constitutional: Positive for malaise/fatigue. Negative for fever.  HENT: Positive for congestion. Negative for sore throat.   Respiratory: Negative for cough and sputum production.   Cardiovascular: Negative for chest pain and palpitations.  Neurological: Negative for headaches.  Psychiatric/Behavioral: Negative for depression. The patient does not have insomnia.    BP 148/86 mmHg  Pulse 79  Temp(Src) 98 F (36.7 C) (Oral)  Ht 5\' 5"  (1.651 m)  Wt 186 lb (84.369 kg)  BMI 30.95 kg/m2  SpO2 100%  LMP 03/20/2015  Physical Exam  Constitutional: She is oriented to person, place, and time and well-developed, well-nourished, and in no distress.  HENT:  Head: Normocephalic and atraumatic.  Right Ear: Tympanic membrane normal.  Left Ear: Tympanic membrane normal.  Nose: Mucosal edema and rhinorrhea present. Right sinus exhibits no maxillary sinus tenderness and no frontal sinus tenderness. Left sinus exhibits no maxillary sinus tenderness and no frontal sinus tenderness.  Mouth/Throat: Uvula is midline, oropharynx is clear and moist and mucous membranes are normal.  Large tonsils -- 2+ noted. No evidence of tonsillitis.  Cardiovascular: Normal rate, regular rhythm, normal heart sounds and intact distal pulses.   Pulmonary/Chest: Effort normal and breath sounds normal. No respiratory distress. She has no wheezes. She has no rales. She exhibits no tenderness.  Genitourinary:  There is a 2 cm area of erythema and induration or R inguinal region without fluctuance or drainage. There is  also a 1 cm region of swelling in the L inguinal region at site of prior abscess. No fluctuance noted. There is also an open pilonidal cyst without active drainage noted in gluteal cleft.  Neurological: She is alert and oriented to person, place, and time.  Skin: Skin is warm and dry. No rash noted.  Psychiatric: Affect normal.  Vitals reviewed.  Assessment/Plan: Elevated BP DASH handout  given. Will check BP again at follow-up in 2 weeks once pain and infection is resolved. If still elevated will begin medication.  Hidradenitis suppurativa Rx Doxycycline. Mobic for pain and swelling. Supportive measures reviewed. Referral to General surgery placed. Follow-up scheduled. Return sooner or ER if anything worsens.  Nasal congestion Secondary to uncontrolled allergy. Will increase fluids. Claritin daily as directed. Rx Flonase to use as directed. Follow-up 2 weeks.

## 2015-04-03 NOTE — Assessment & Plan Note (Signed)
DASH handout given. Will check BP again at follow-up in 2 weeks once pain and infection is resolved. If still elevated will begin medication.

## 2015-04-03 NOTE — Assessment & Plan Note (Signed)
Secondary to uncontrolled allergy. Will increase fluids. Claritin daily as directed. Rx Flonase to use as directed. Follow-up 2 weeks.

## 2015-04-09 ENCOUNTER — Telehealth: Payer: Self-pay | Admitting: Physician Assistant

## 2015-04-09 NOTE — Telephone Encounter (Signed)
Pt states she faxed FMLA paperwork today and wants call to let her know we received it. Cell #

## 2015-04-10 NOTE — Telephone Encounter (Signed)
Patient called and stated that she did miss work prior to [employer] with [employer]. She was out of work from January 10, - March 13, 2015.

## 2015-04-10 NOTE — Telephone Encounter (Signed)
Patient informed, understood & agreed/SLS  

## 2015-04-10 NOTE — Telephone Encounter (Signed)
Patient called back requesting to speak with Einar Pheasant before he fax her FMLA form. She states that she needs 10-15 days per month and she needs for him to put 10-15 days on the form.

## 2015-04-10 NOTE — Telephone Encounter (Signed)
LMOM with contact name and number for return call RE: FMLA paperwork received and provider needs to know if there are dates she missed work prior to Establishing care with him that need to be included on forms/SLS 02/10

## 2015-04-10 NOTE — Telephone Encounter (Signed)
Spoke with patient and verified that she was out of work Jan. 9, 10, 11, 12 and returned to work on Mar 13, 2015, as needed days covered for [facility] paperwork; also inquired as to what she feels she would need for days per month & hours per day for episodes, pt replied 10-15 days monthly and 1/2 day for appointments, etc. Informed patient that it was unlikely that provider would give that amount of days on FMLA paperwork [place] is more along the lines of [employer] Disability]; provider informed and will take information into consideration/SLS 2/10

## 2015-04-10 NOTE — Telephone Encounter (Signed)
I have reviewed and will write for 1-3 episodes per month with 2-3 days per month (total 9 days per month). If she is needing that many days a month then she will have to consider filing short-term disability.

## 2015-04-13 ENCOUNTER — Telehealth: Payer: Self-pay | Admitting: Physician Assistant

## 2015-04-13 NOTE — Telephone Encounter (Signed)
Patient request that her FMLA papers be faxed to [phone]. Patient states that they had not received them.

## 2015-04-14 NOTE — Telephone Encounter (Signed)
This will be taken care of today. I did not receive papers until Friday and was out sick yesterday. Please inform patient.

## 2015-04-14 NOTE — Telephone Encounter (Signed)
Informed patient of below.

## 2015-04-17 ENCOUNTER — Ambulatory Visit: Payer: BLUE CROSS/BLUE SHIELD | Admitting: Physician Assistant

## 2015-04-20 NOTE — Telephone Encounter (Signed)
Questions 14 and 14A was left blank or does not have enough information per call from the pt. She needs call back before we resubmit it. It has to be done on office letterhead or updated on the forms. These are sent to her absence and return dept. Please call her at [phone].

## 2015-04-22 NOTE — Telephone Encounter (Addendum)
Patient inquiring about information below and would like to speak with you regarding the status of message below. Please advise patient directly best [phone]

## 2015-04-23 NOTE — Telephone Encounter (Signed)
This is the first I have seen that there was an issue with paperwork. Seems there have been multiple notes since 2/20 but none were sent to me or my CMA before today.   I will address personally when in office tomorrow. Please call and inform the patient that Ivin Booty (paperwork CMA) is not in the office today, nor am I. I will speak with Ivin Booty in the morning so we can get everything fixed and I will call the patient personally when everything is re-sent.

## 2015-04-23 NOTE — Telephone Encounter (Signed)
Pt called back in to follow up on a call back. Pt says that she has limited time to have this form completed.   Please assist and advise further.

## 2015-04-24 ENCOUNTER — Telehealth: Payer: Self-pay | Admitting: Physician Assistant

## 2015-04-24 NOTE — Telephone Encounter (Signed)
Paperwork addended and refaxed

## 2015-04-24 NOTE — Telephone Encounter (Signed)
LM to notify pt °

## 2015-04-24 NOTE — Telephone Encounter (Signed)
Please call and inform patient that I have updated her FMLA and am re-faxing.

## 2015-04-28 ENCOUNTER — Encounter: Payer: Self-pay | Admitting: Physician Assistant

## 2015-04-28 NOTE — Telephone Encounter (Addendum)
FMLA Addendum paperwork with OV note resent via fax, post telephone conversation between [employer] & provider/SLS 02/28

## 2015-05-08 ENCOUNTER — Ambulatory Visit: Payer: BLUE CROSS/BLUE SHIELD | Admitting: Physician Assistant

## 2015-05-14 ENCOUNTER — Telehealth: Payer: Self-pay | Admitting: Physician Assistant

## 2015-05-14 ENCOUNTER — Encounter: Payer: Self-pay | Admitting: Physician Assistant

## 2015-05-14 NOTE — Telephone Encounter (Signed)
Was able to speak to patient -- Notes her previous FMLA is under review. MD Is a clinical reviewer from her HR for FMLA.  Diagnosis on FMLA and supportive information on FMLA confirmed as well as name of surgery officetreating medical issue given to provider.

## 2015-05-14 NOTE — Telephone Encounter (Signed)
Dr Deneise Lever called to speak with Einar Pheasant in ref to this patient. He is requesting a Peer to Peer consult about FMLA. Informed that Einar Pheasant was out of the office and he requested a call back when he returns at [phone]

## 2015-05-14 NOTE — Telephone Encounter (Signed)
Sent MyChart message to patient but please call. Returned call to Dr. Dyke Brackett [facility] who is wanting information about patients Hidradenitis Suppurativa for FMLA. Mentioned to him that we had taken care of FMLA recently for patient but would need patients approval before giving him any more information.   Please call patient to let her know of this Doctor's request and to get permission to share information with provider as I did not have him listed as part of her care team.  I am happy to help with this but wanted [place] to touch base with her first.

## 2015-05-17 ENCOUNTER — Other Ambulatory Visit: Payer: Self-pay | Admitting: Physician Assistant

## 2015-05-18 NOTE — Telephone Encounter (Signed)
Medication filled to pharmacy as requested.   

## 2015-05-20 ENCOUNTER — Encounter: Payer: Self-pay | Admitting: Physician Assistant

## 2015-05-20 ENCOUNTER — Ambulatory Visit (INDEPENDENT_AMBULATORY_CARE_PROVIDER_SITE_OTHER): Payer: BLUE CROSS/BLUE SHIELD | Admitting: Physician Assistant

## 2015-05-20 VITALS — BP 140/80 | HR 92 | Temp 98.2°F | Ht 65.0 in | Wt 185.4 lb

## 2015-05-20 DIAGNOSIS — L732 Hidradenitis suppurativa: Secondary | ICD-10-CM | POA: Diagnosis not present

## 2015-05-20 NOTE — Progress Notes (Signed)
Pre visit review using our clinic review tool, if applicable. No additional management support is needed unless otherwise documented below in the visit note. 

## 2015-05-20 NOTE — Patient Instructions (Signed)
Stay hydrated. Start a Mucinex (plain) twice daily.   I will check on the status of your surgery referral.  Follow-up as needed.

## 2015-05-21 NOTE — Progress Notes (Signed)
    Patient presents to clinic today for follow-up of chronic hidradenitis suppurativa. Was referred to General Surgery at last visit but states she has not received a call for appointment. Has been following supportive measures discussed at last visit. Denies current abscess. Denies fever, chills, malaise.  Past Medical History  Diagnosis Date  . Hidradenitis suppurativa     Current Outpatient Prescriptions on File Prior to Visit  Medication Sig Dispense Refill  . fluticasone (FLONASE) 50 MCG/ACT nasal spray Place 2 sprays into both nostrils daily. 16 g 6  . meloxicam (MOBIC) 15 MG tablet TAKE 1 TABLET (15 MG TOTAL) BY MOUTH DAILY. (Patient not taking: Reported on 05/20/2015) 30 tablet 0   No current facility-administered medications on file prior to visit.    Allergies  Allergen Reactions  . Hydrocodone   . Penicillins   . Sulfa Antibiotics   . Oxycodone-Acetaminophen Itching    Family History  Problem Relation Age of Onset  . Hypertension Mother   . Cancer Mother 56    Ovary/Uterus  . Hypertension Father   . Thyroid disease Paternal Uncle   . Diabetes Maternal Grandmother   . Cancer Paternal Grandmother     Breast  . Cancer Paternal Grandfather     Prostate    Social History   Social History  . Marital Status: Single    Spouse Name: N/A  . Number of Children: N/A  . Years of Education: N/A   Social History Main Topics  . Smoking status: Never Smoker   . Smokeless tobacco: Never Used  . Alcohol Use: 0.0 oz/week    0 Standard drinks or equivalent per week     Comment: Wine-occo.  . Drug Use: No  . Sexual Activity: Not Asked   Other Topics Concern  . None   Social History Narrative   Review of Systems - See HPI.  All other ROS are negative.  BP 140/80 mmHg  Pulse 92  Temp(Src) 98.2 F (36.8 C) (Oral)  Ht 5\' 5"  (1.651 m)  Wt 185 lb 6.4 oz (84.097 kg)  BMI 30.85 kg/m2  SpO2 99%  LMP 05/15/2015  Physical Exam  Constitutional: She is oriented to  person, place, and time and well-developed, well-nourished, and in no distress.  HENT:  Head: Normocephalic and atraumatic.  Cardiovascular: Normal rate, regular rhythm, normal heart sounds and intact distal pulses.   Pulmonary/Chest: Effort normal and breath sounds normal. No respiratory distress. She has no wheezes. She has no rales. She exhibits no tenderness.  Neurological: She is alert and oriented to person, place, and time.  Skin: Skin is warm and dry. No rash noted.  Vitals reviewed.   No results found for this or any previous visit (from the past 2160 hour(s)).  Assessment/Plan: Hidradenitis suppurativa Doing well at the moment. Will continue supportive measures. Will check on status of surgery referral for ongoing management. Patient to keep phone on so appointment can be scheduled.

## 2015-05-21 NOTE — Assessment & Plan Note (Signed)
Doing well at the moment. Will continue supportive measures. Will check on status of surgery referral for ongoing management. Patient to keep phone on so appointment can be scheduled.

## 2015-12-23 ENCOUNTER — Encounter: Payer: Self-pay | Admitting: Medical

## 2015-12-23 ENCOUNTER — Ambulatory Visit (INDEPENDENT_AMBULATORY_CARE_PROVIDER_SITE_OTHER): Payer: BLUE CROSS/BLUE SHIELD | Admitting: Medical

## 2015-12-23 VITALS — BP 122/86 | HR 70 | Temp 98.1°F | Ht 65.0 in | Wt 169.4 lb

## 2015-12-23 DIAGNOSIS — R8299 Other abnormal findings in urine: Secondary | ICD-10-CM | POA: Diagnosis not present

## 2015-12-23 DIAGNOSIS — R319 Hematuria, unspecified: Secondary | ICD-10-CM

## 2015-12-23 DIAGNOSIS — M549 Dorsalgia, unspecified: Secondary | ICD-10-CM | POA: Diagnosis not present

## 2015-12-23 DIAGNOSIS — M545 Low back pain: Secondary | ICD-10-CM

## 2015-12-23 DIAGNOSIS — R82998 Other abnormal findings in urine: Secondary | ICD-10-CM

## 2015-12-23 LAB — COMPREHENSIVE METABOLIC PANEL
ALBUMIN: 4.5 g/dL (ref 3.5–5.2)
ALK PHOS: 62 U/L (ref 39–117)
ALT: 23 U/L (ref 0–35)
AST: 21 U/L (ref 0–37)
BUN: 9 mg/dL (ref 6–23)
CO2: 28 mEq/L (ref 19–32)
Calcium: 9.8 mg/dL (ref 8.4–10.5)
Chloride: 104 mEq/L (ref 96–112)
Creatinine, Ser: 0.78 mg/dL (ref 0.40–1.20)
GFR: 106.55 mL/min (ref >60.00)
Glucose, Bld: 98 mg/dL (ref 70–99)
POTASSIUM: 3.5 meq/L (ref 3.5–5.1)
Sodium: 141 mEq/L (ref 135–145)
TOTAL PROTEIN: 8.6 g/dL — AB (ref 6.0–8.3)
Total Bilirubin: 0.4 mg/dL (ref 0.2–1.2)

## 2015-12-23 LAB — POC URINALSYSI DIPSTICK (AUTOMATED)
Bilirubin, UA: NEGATIVE
Glucose, UA: NEGATIVE
Ketones, UA: NEGATIVE
NITRITE UA: NEGATIVE
PH UA: 6.5
PROTEIN UA: NEGATIVE
Spec Grav, UA: 1.02
UROBILINOGEN UA: 0.2

## 2015-12-23 MED ORDER — DICLOFENAC SODIUM 75 MG PO TBEC: 75.0000 mg | DELAYED_RELEASE_TABLET | Freq: Two times a day (BID) | ORAL | 0 refills | Status: DC

## 2015-12-23 MED ORDER — KETOROLAC TROMETHAMINE 60 MG/2ML IM SOLN
60.0000 mg | Freq: Once | INTRAMUSCULAR | Status: AC
  Administered 2015-12-23: 60 mg via INTRAMUSCULAR

## 2015-12-23 MED ORDER — CYCLOBENZAPRINE HCL 10 MG PO TABS: 10.0000 mg | ORAL_TABLET | Freq: Every day | ORAL | 0 refills | Status: DC

## 2015-12-23 NOTE — Patient Instructions (Addendum)
For your back pain/sciatic area we gave toradol 60 mg im. Flexeril muscle relaxant to use at night. Start tomorrow diclofenac. If using diclofenac then stop ibuprofen.  Ua dip done in office today. Also please go by lab for cmp. Check kidney function.  If pain location changes or pain worsens notify us.   Follow up in 7 days or as needed

## 2015-12-23 NOTE — Progress Notes (Signed)
Pre visit review using our clinic review tool, if applicable. No additional management support is needed unless otherwise documented below in the visit note. 

## 2015-12-23 NOTE — Progress Notes (Signed)
Subjective:    Patient ID: Alice Tucker, female    DOB: 01-31-1981, 35 y.o.   MRN: [id number]  HPI   Pt in with some rt side back pain. Pain came on Sunday. Pt hurts with sitting and movements. No fall or injury. Pt states history of kidney stone but pain is not like a stone per pain.   No recent exercise. Pain just came on slowly.  Pt taking motrin for the pain. Only helps for 2 hours then pain comes back.  Pain presently about 6/10. No med today for pain.   LMP- Dec 15 2015. Normal cycle.  No saddle anesthesia. No leg weakness. No incontinence. No foot drop.  Review of Systems  Constitutional: Negative for chills, fatigue and fever.  Respiratory: Negative for cough, chest tightness, shortness of breath and wheezing.   Cardiovascular: Negative for chest pain and palpitations.  Gastrointestinal: Negative for abdominal pain.  Musculoskeletal: Positive for back pain. Negative for arthralgias, gait problem and joint swelling.  Skin: Negative for rash.  Hematological: Negative for adenopathy. Does not bruise/bleed easily.  Psychiatric/Behavioral: Negative for behavioral problems and confusion.    Past Medical History:  Diagnosis Date  . Hidradenitis suppurativa      Social History   Social History  . Marital status: Single    Spouse name: N/A  . Number of children: N/A  . Years of education: N/A   Occupational History  . Not on file.   Social History Main Topics  . Smoking status: Never Smoker  . Smokeless tobacco: Never Used  . Alcohol use 0.0 oz/week     Comment: Wine-occo.  . Drug use: No  . Sexual activity: Not on file   Other Topics Concern  . Not on file   Social History Narrative  . No narrative on file    Past Surgical History:  Procedure Laterality Date  . SKIN SURGERY     secondary to hidradenitis suppurativa  . UTERINE FIBROID SURGERY      Family History  Problem Relation Age of Onset  . Hypertension Mother   . Cancer Mother 64   Ovary/Uterus  . Hypertension Father   . Thyroid disease Paternal Uncle   . Diabetes Maternal Grandmother   . Cancer Paternal Grandmother     Breast  . Cancer Paternal Grandfather     Prostate    Allergies  Allergen Reactions  . Hydrocodone   . Penicillins   . Sulfa Antibiotics   . Oxycodone-Acetaminophen Itching    Current Outpatient Prescriptions on File Prior to Visit  Medication Sig Dispense Refill  . clarithromycin (BIAXIN) 500 MG tablet Take 2 tablets by mouth daily for 10 days.    . fluticasone (FLONASE) 50 MCG/ACT nasal spray Place 2 sprays into both nostrils daily. 16 g 6  . meloxicam (MOBIC) 15 MG tablet TAKE 1 TABLET (15 MG TOTAL) BY MOUTH DAILY. 30 tablet 0   No current facility-administered medications on file prior to visit.     BP 122/86 (BP Location: Right Arm, Patient Position: Sitting)   Pulse 70   Temp 98.1 F (36.7 C) (Oral)   Ht 5\' 5"  (1.651 m)   Wt 169 lb 6.4 oz (76.8 kg)   LMP 12/18/2015   SpO2 98%   BMI 28.19 kg/m       Objective:   Physical Exam  General Appearance- Not in acute distress.    Chest and Lung Exam Auscultation: Breath sounds:-Normal. Clear even and unlabored. Adventitious sounds:- No Adventitious  sounds.  Cardiovascular Auscultation:Rythm - Regular, rate and rythm. Heart Sounds -Normal heart sounds.  Abdomen Inspection:-Inspection Normal.  Palpation/Perucssion: Palpation and Percussion of the abdomen reveal- Non Tender, No Rebound tenderness, No rigidity(Guarding) and No Palpable abdominal masses.  Liver:-Normal.  Spleen:- Normal.   Back  no Mid lumbar spine tenderness to palpation. Pain on straight leg lift. Pain on lateral movements and flexion/extension of the spine. Pain mostly rt si area pain.and some radiating to posterior iliac crest area.   Lower ext neurologic  L5-S1 sensation intact bilaterally. Normal patellar reflexes bilaterally. No foot drop bilaterally.  Lower ext- 5/5 equal and symmetric  strength.  Skin-no rash.      Assessment & Plan:  For your back pain/sciatic area we gave toradol 60 mg im. Flexeril muscle relaxant to use at night. Start tomorrow diclofenac. If using diclofenac then stop ibuprofen.  Ua dip done in office today. Also please go by lab for cmp. Check kidney function.  If pain location changes or pain worsens notify us.   Follow up in 7 days or as needed  Alice Tucker, Percell Miller, [employer]

## 2015-12-25 LAB — URINE CULTURE

## 2016-04-08 ENCOUNTER — Telehealth: Payer: Self-pay | Admitting: Physician Assistant

## 2016-04-08 NOTE — Telephone Encounter (Signed)
I am assuming she wants the referral due to her history of hidradenitis.  Please verify the reason for referral request so that I can place the correct referral for her.

## 2016-04-08 NOTE — Telephone Encounter (Signed)
Called left vm to call back or send mychart message with dx to refer her for

## 2016-04-08 NOTE — Telephone Encounter (Signed)
Patient called to advise that she would like to be referred to a dermatologist. She does not want to have surgery. She is thinking the following location....  [facility] ?    Medical clinic in [place], [place]  Address: [address], [place], [place] [address]  Hours:  Open ? Closes 5PM                       Phone: [phone]

## 2016-06-08 ENCOUNTER — Ambulatory Visit: Payer: Self-pay | Admitting: Family Medicine

## 2016-07-29 ENCOUNTER — Ambulatory Visit: Payer: Self-pay | Admitting: Physician Assistant

## 2016-07-29 DIAGNOSIS — Z0289 Encounter for other administrative examinations: Secondary | ICD-10-CM

## 2016-08-09 ENCOUNTER — Telehealth: Payer: Self-pay | Admitting: Physician Assistant

## 2016-08-09 NOTE — Telephone Encounter (Signed)
Patient requesting to switch pcp from [facility] to [facility].  Patient states it is too far for her to drive to [place] office.  She would like an appt with new pcp within the next week if possible.

## 2016-08-09 NOTE — Telephone Encounter (Signed)
Ok with me 

## 2016-08-10 NOTE — Telephone Encounter (Signed)
Ok with me 

## 2016-08-11 NOTE — Telephone Encounter (Signed)
Left voice mail message notifying patient. °

## 2016-08-12 ENCOUNTER — Ambulatory Visit: Payer: Self-pay | Admitting: Physician Assistant

## 2016-08-15 ENCOUNTER — Encounter: Payer: Self-pay | Admitting: Family

## 2016-08-15 ENCOUNTER — Ambulatory Visit (INDEPENDENT_AMBULATORY_CARE_PROVIDER_SITE_OTHER): Payer: BLUE CROSS/BLUE SHIELD | Admitting: Family

## 2016-08-15 VITALS — BP 130/82 | HR 82 | Temp 98.2°F | Resp 16 | Ht 65.0 in | Wt 152.0 lb

## 2016-08-15 DIAGNOSIS — R5383 Other fatigue: Secondary | ICD-10-CM

## 2016-08-15 DIAGNOSIS — E559 Vitamin D deficiency, unspecified: Secondary | ICD-10-CM | POA: Diagnosis not present

## 2016-08-15 DIAGNOSIS — M2550 Pain in unspecified joint: Secondary | ICD-10-CM

## 2016-08-15 LAB — BASIC METABOLIC PANEL
BUN: 9 mg/dL (ref 6–23)
CALCIUM: 9.8 mg/dL (ref 8.4–10.5)
CO2: 29 meq/L (ref 19–32)
CREATININE: 0.8 mg/dL (ref 0.40–1.20)
Chloride: 103 mEq/L (ref 96–112)
GFR: 103.13 mL/min (ref >60.00)
GLUCOSE: 86 mg/dL (ref 70–99)
Potassium: 3.4 mEq/L — ABNORMAL LOW (ref 3.5–5.1)
SODIUM: 139 meq/L (ref 135–145)

## 2016-08-15 LAB — HEPATIC FUNCTION PANEL
ALT: 10 U/L (ref 0–35)
AST: 14 U/L (ref 0–37)
Albumin: 4.2 g/dL (ref 3.5–5.2)
Alkaline Phosphatase: 59 U/L (ref 39–117)
BILIRUBIN DIRECT: 0.1 mg/dL (ref 0.0–0.3)
BILIRUBIN TOTAL: 0.4 mg/dL (ref 0.2–1.2)
Total Protein: 7.8 g/dL (ref 6.0–8.3)

## 2016-08-15 LAB — CBC WITH DIFFERENTIAL/PLATELET
BASOS ABS: 0 10*3/uL (ref 0.0–0.1)
Basophils Relative: 0.8 % (ref 0.0–3.0)
Eosinophils Absolute: 0.2 10*3/uL (ref 0.0–0.7)
Eosinophils Relative: 4.3 % (ref 0.0–5.0)
HCT: 32.6 % — ABNORMAL LOW (ref 36.0–46.0)
Hemoglobin: 10.4 g/dL — ABNORMAL LOW (ref 12.0–15.0)
LYMPHS ABS: 2 10*3/uL (ref 0.7–4.0)
Lymphocytes Relative: 39 % (ref 12.0–46.0)
MCHC: 31.8 g/dL (ref 30.0–36.0)
MCV: 73.3 fl — ABNORMAL LOW (ref 78.0–100.0)
MONOS PCT: 11.2 % (ref 3.0–12.0)
Monocytes Absolute: 0.6 10*3/uL (ref 0.1–1.0)
NEUTROS ABS: 2.3 10*3/uL (ref 1.4–7.7)
NEUTROS PCT: 44.7 % (ref 43.0–77.0)
PLATELETS: 245 10*3/uL (ref 150.0–400.0)
RBC: 4.46 Mil/uL (ref 3.87–5.11)
RDW: 19.4 % — ABNORMAL HIGH (ref 11.5–15.5)
WBC: 5.2 10*3/uL (ref 4.0–10.5)

## 2016-08-15 LAB — SEDIMENTATION RATE: Sed Rate: 45 mm/hr — ABNORMAL HIGH (ref 0–20)

## 2016-08-15 LAB — TSH: TSH: 1.77 u[IU]/mL (ref 0.35–4.50)

## 2016-08-15 NOTE — Progress Notes (Addendum)
Subjective:    Patient ID: Alice Tucker, female    DOB: 10/04/1980, 36 y.o.   MRN: [id number]  HPI   Alice Tucker is a 36 yr old female who presents today to establish care- transferring from another provider at our practie.   Seasonal allergies- reports that this past Thursday she began with drainage. She then developed chest congestion and hoarseness. She denies fever. She reports she has had some sick contacts at work.   She reports + fatigue/joint pain, myalgia.  She reports that she has some cousins with lupus. Denies family hx of Rheumatoid  Arthritis or sarcoid.  She denies skin rashes.    Hidradenitis suppurtiva-  She is requesting FMLA forms for this.    Review of Systems See HPI  Past Medical History:  Diagnosis Date  . Hidradenitis suppurativa      Social History   Social History  . Marital status: Single    Spouse name: N/A  . Number of children: N/A  . Years of education: N/A   Occupational History  . Not on file.   Social History Main Topics  . Smoking status: Never Smoker  . Smokeless tobacco: Never Used  . Alcohol use No  . Drug use: No  . Sexual activity: Not on file   Other Topics Concern  . Not on file   Social History Narrative  . No narrative on file    Past Surgical History:  Procedure Laterality Date  . SKIN SURGERY     secondary to hidradenitis suppurativa  . UTERINE FIBROID SURGERY      Family History  Problem Relation Age of Onset  . Hypertension Mother   . Cancer Mother 95       Ovary/Uterus  . Hypertension Father   . Thyroid disease Paternal Uncle   . Heart attack Paternal Uncle        "due to thyroid disorder"  . Diabetes Maternal Grandmother   . Cancer Paternal Grandmother        Breast  . Cancer Paternal Grandfather        Prostate    Allergies  Allergen Reactions  . Penicillins Itching  . Sulfa Antibiotics Itching  . Hydrocodone Rash  . Oxycodone-Acetaminophen Itching    No current outpatient  prescriptions on file prior to visit.   No current facility-administered medications on file prior to visit.     BP 130/82 (BP Location: Right Arm, Cuff Size: Normal)   Pulse 82   Temp 98.2 F (36.8 C) (Oral)   Resp 16   Ht 5\' 5"  (1.651 m)   Wt 152 lb (68.9 kg)   LMP 07/26/2016   SpO2 100%   BMI 25.29 kg/m       Objective:   Physical Exam  Constitutional: She is oriented to person, place, and time. She appears well-developed and well-nourished.  HENT:  Head: Normocephalic and atraumatic.  Right Ear: Tympanic membrane and ear canal normal.  Left Ear: Tympanic membrane and ear canal normal.  Mouth/Throat: No oropharyngeal exudate or posterior oropharyngeal edema.  Mild oropharyngeal erythema noted + voice hoarseness noted.  Eyes: Left eye exhibits no discharge. No scleral icterus.  Cardiovascular: Normal rate, regular rhythm and normal heart sounds.   No murmur heard. Pulmonary/Chest: Effort normal and breath sounds normal. No respiratory distress. She has no wheezes.  Lymphadenopathy:    She has cervical adenopathy.  Neurological: She is alert and oriented to person, place, and time.  Skin: Skin is warm and  dry.  Small (2-36mm wide) superficial cyst noted on left buttock.   Psychiatric: She has a normal mood and affect. Her behavior is normal. Judgment and thought content normal.          Assessment & Plan:  Viral URI-  Symptoms most consistent with viral illness. Discussed supportive measures. Advised pt to call if symptoms worsen or if symptoms not resolved in next 3-5 days.  Joint pain/myalgia- will check autoimmune panel, RMSF, Lyme, TSH,CBC,CMET.    Hidradenitis- small cyst left buttock. Superficial. I don't think symptoms warrant oral antibiotics. Advised pt:  Soak twice daily in a warm tub. Let us know if areas enlarge or fail to improve.

## 2016-08-15 NOTE — Patient Instructions (Signed)
Your congestion symptoms are likely viral and should be resolved in the next 3-5 days. Please let up know if symptoms worsen or if symptoms fail to improve.   For hidradentitis bumps on your buttocks, please soak twice daily in a warm tub. Let us know if areas enlarge or fail to improve. Please complete lab work for further evaluation of your fatigue/muscle and joint pain.

## 2016-08-16 LAB — ROCKY MTN SPOTTED FVR ABS PNL(IGG+IGM)
RMSF IGM: NOT DETECTED
RMSF IgG: NOT DETECTED

## 2016-08-16 LAB — LYME AB/WESTERN BLOT REFLEX

## 2016-08-16 LAB — RHEUMATOID FACTOR: Rhuematoid fact SerPl-aCnc: <14 IU/mL (ref <14)

## 2016-08-16 LAB — ANA: Anti Nuclear Antibody(ANA): NEGATIVE

## 2016-08-18 ENCOUNTER — Telehealth: Payer: Self-pay | Admitting: *Deleted

## 2016-08-18 DIAGNOSIS — D649 Anemia, unspecified: Secondary | ICD-10-CM

## 2016-08-18 DIAGNOSIS — E876 Hypokalemia: Secondary | ICD-10-CM

## 2016-08-18 LAB — VITAMIN D 1,25 DIHYDROXY
Vitamin D 1, 25 (OH)2 Total: 76 pg/mL — ABNORMAL HIGH (ref 18–72)
Vitamin D2 1, 25 (OH)2: <8 pg/mL
Vitamin D3 1, 25 (OH)2: 76 pg/mL

## 2016-08-18 NOTE — Telephone Encounter (Signed)
Notified pt of below. Not currently taking Vitamin D. Scheduled lab appt for 08/26/16 for repeat bmet and iron as it is too late to add iron testing now. Future lab orders entered.

## 2016-08-18 NOTE — Telephone Encounter (Signed)
Vitamin D is a bit high. If she is taking a vit D supplement she should stop, otherwise I am not concerned.   Lyme testing and [place] testing are negative.   Rheumatoid and lupus screens are negative.  Thyroid looks good. Kidneys, liver function looks good.  Potassium is a touch low. She should focus on a potassium rich diet as below. Lets repeat bmet in 1 week, dx hypokalemia.   Also, she is anemic. Likely due to iron deficiency. Lets ask the lab to add on serum iron please, dx anemia.    High potassium content foods  Highest content (>25 meq/100 g) High content (>6.2 meq/100 g)   Vegetables   Spinach   Tomatoes   Broccoli   Winter squash   Beets   Carrots   Cauliflower   Potatoes   Fruits   Bananas  Dried figs Cantaloupe  Molasses Kiwis  Seaweed Oranges  Very high content (>12.5 meq/100 g) Mangos  Dried fruits (dates, prunes) Meats  Nuts Ground beef  Avocados Steak  Bran cereals Pork  Wheat germ Veal  Lima beans Arline Asp

## 2016-08-18 NOTE — Telephone Encounter (Signed)
Notified pt FMLA completed and she would like me to fax papers. Forms faxed to [phone]. Please advise regarding test results?

## 2016-08-26 ENCOUNTER — Telehealth: Payer: Self-pay | Admitting: Family

## 2016-08-26 ENCOUNTER — Other Ambulatory Visit (INDEPENDENT_AMBULATORY_CARE_PROVIDER_SITE_OTHER): Payer: BLUE CROSS/BLUE SHIELD

## 2016-08-26 DIAGNOSIS — E876 Hypokalemia: Secondary | ICD-10-CM

## 2016-08-26 DIAGNOSIS — D649 Anemia, unspecified: Secondary | ICD-10-CM

## 2016-08-26 LAB — BASIC METABOLIC PANEL
BUN: 14 mg/dL (ref 6–23)
CHLORIDE: 104 meq/L (ref 96–112)
CO2: 26 mEq/L (ref 19–32)
CREATININE: 0.7 mg/dL (ref 0.40–1.20)
Calcium: 9.1 mg/dL (ref 8.4–10.5)
GFR: 120.29 mL/min (ref >60.00)
GLUCOSE: 108 mg/dL — AB (ref 70–99)
Potassium: 3.4 mEq/L — ABNORMAL LOW (ref 3.5–5.1)
Sodium: 138 mEq/L (ref 135–145)

## 2016-08-26 LAB — IRON: IRON: 22 ug/dL — AB (ref 42–145)

## 2016-08-26 NOTE — Telephone Encounter (Signed)
Pt dropped off document to be filled out (Sick Verification Form- 1 sheet) Pt would like to have document fax to [phone] and to call pt to inform her that document was faxed (pt tel [phone]). Document put at front office tray.

## 2016-08-26 NOTE — Telephone Encounter (Signed)
MyChart message sent.  Lab ordered.

## 2016-08-26 NOTE — Telephone Encounter (Signed)
Potassium is low.  Recommend high potassium diet, repeat bmet in 2 weeks.    High potassium content foods  Highest content (>25 meq/100 g) High content (>6.2 meq/100 g)   Vegetables   Spinach   Tomatoes   Broccoli   Winter squash   Beets   Carrots   Cauliflower   Potatoes   Fruits   Bananas  Dried figs Cantaloupe  Molasses Kiwis  Seaweed Oranges  Very high content (>12.5 meq/100 g) Mangos  Dried fruits (dates, prunes) Meats  Nuts Ground beef  Avocados Steak  Bran cereals Pork  Wheat germ Veal  Lima beans Arline Asp

## 2016-08-29 NOTE — Telephone Encounter (Signed)
Received Sick Verification Form to be completed for the dates 08/13/16 through 08/18/16 per notation, forwarded to provider/SLS 07/02

## 2016-09-01 NOTE — Telephone Encounter (Signed)
I believe I filled this form out a few days ago.

## 2016-09-13 NOTE — Telephone Encounter (Signed)
Form received from provider, faxed to [place] at [phone]; attempt to reach pt via phone, no answer, no ans machine/SLS 07/17

## 2017-01-23 ENCOUNTER — Other Ambulatory Visit: Payer: Self-pay | Admitting: Radiology

## 2017-06-21 ENCOUNTER — Telehealth: Payer: Self-pay | Admitting: Family

## 2017-06-21 ENCOUNTER — Ambulatory Visit: Payer: Self-pay | Admitting: *Deleted

## 2017-06-21 NOTE — Telephone Encounter (Signed)
FYI to PCP

## 2017-06-21 NOTE — Telephone Encounter (Signed)
Pt's current PCP is Debbrah Alar at the [facility] location. Pt states she was not happy with the level of care at this location and is requesting to transfer care back to [facility].

## 2017-06-21 NOTE — Telephone Encounter (Signed)
Pt calling stating she had a syncopal episode while at home on 4/23.  Pt states on yesterday she felt a sharp pain in her groin area and went to sit on the toilet seat in the bathroom. Pt states she remembers falling off of the toilet and hitting her head and being on the floor and then regaining consciousness and feeling short of breath. Pt states she does not know how long she was on the floor before regaining consciousness. Pt was seen in the ED and treated for syncopal episode on 06/20/17. Pt states she is not having any dizziness or  any changes in her vision at this time. Pt is now complaining of pain to the right temple and right part of they eye and states her hip is also sore. Appt scheduled with PCP on 4/26 and pt advised to call office back if current symptoms worsened before seen for appt on Friday. Understanding verbalized.

## 2017-06-22 NOTE — Telephone Encounter (Signed)
Pt has been scheduled.  °

## 2017-06-22 NOTE — Telephone Encounter (Signed)
Ok with me 

## 2017-06-23 ENCOUNTER — Ambulatory Visit: Payer: BLUE CROSS/BLUE SHIELD | Admitting: Family

## 2017-06-23 ENCOUNTER — Encounter: Payer: Self-pay | Admitting: Family

## 2017-06-23 VITALS — BP 127/91 | HR 74 | Temp 98.3°F | Resp 18 | Ht 65.0 in | Wt 155.4 lb

## 2017-06-23 DIAGNOSIS — E876 Hypokalemia: Secondary | ICD-10-CM

## 2017-06-23 DIAGNOSIS — R55 Syncope and collapse: Secondary | ICD-10-CM

## 2017-06-23 DIAGNOSIS — D649 Anemia, unspecified: Secondary | ICD-10-CM | POA: Diagnosis not present

## 2017-06-23 DIAGNOSIS — S060X9A Concussion with loss of consciousness of unspecified duration, initial encounter: Secondary | ICD-10-CM

## 2017-06-23 LAB — BASIC METABOLIC PANEL
BUN: 9 mg/dL (ref 6–23)
CO2: 28 mEq/L (ref 19–32)
Calcium: 9.5 mg/dL (ref 8.4–10.5)
Chloride: 105 mEq/L (ref 96–112)
Creatinine, Ser: 0.8 mg/dL (ref 0.40–1.20)
GFR: 102.66 mL/min (ref >60.00)
GLUCOSE: 99 mg/dL (ref 70–99)
POTASSIUM: 3.8 meq/L (ref 3.5–5.1)
Sodium: 143 mEq/L (ref 135–145)

## 2017-06-23 LAB — IRON: Iron: 26 ug/dL — ABNORMAL LOW (ref 42–145)

## 2017-06-23 LAB — FERRITIN: FERRITIN: 4 ng/mL — AB (ref 10.0–291.0)

## 2017-06-23 MED ORDER — TRAMADOL HCL 50 MG PO TABS: 50.0000 mg | ORAL_TABLET | Freq: Three times a day (TID) | ORAL | 0 refills | Status: AC | PRN

## 2017-06-23 NOTE — Progress Notes (Signed)
Subjective:    Patient ID: Alice Tucker, female    DOB: Dec 15, 1980, 37 y.o.   MRN: [id number]  HPI  Patient is a 37 year old female who presents today for ER follow-up.  ER visit is reviewed in epic.  She reports that on 4/23 she was walking to the bathroom in the middle of the night when she developed sharp left sided groin pain.  She then began to feel lightheaded and sweaty.  She had associated nausea.  She sat down on the commode and states the next thing that she noted she was on the floor.  She did report that she had a flare of her hidradenitis in the left groin with a draining cyst at that time.  She subsequently presented to the emergency department where they performed a CT angios which was negative for PE.  She was given presumptive diagnosis of vasovagal syncope.  Lab work was not performed during this ER visit.  K+ was 3.4. She was mildly anemic with a hemoglobin on 11.2 (reports heavy menses)chest X-ray negative. EKG noted NSR.       She reports that she hit her head during her fall.  She thinks her head hit the top of a wooden plunger and her right hip is sore.  Reports that pain radiates into the jaw area.  Reports that she had a scratch above her right eyebrow.  She was home alone, no bowel or bladder. Denied chest pain but felt short of breath.   Since returning home she has been "drawing blanks" having trouble remembering things. Denies nausea.  Reports  light senstivity in the right ear.     Review of Systems See HPI  Past Medical History:  Diagnosis Date  . Hidradenitis suppurativa      Social History   Socioeconomic History  . Marital status: Single    Spouse name: Not on file  . Number of children: Not on file  . Years of education: Not on file  . Highest education level: Not on file  Occupational History  . Not on file  Social Needs  . Financial resource strain: Not on file  . Food insecurity:    Worry: Not on file    Inability: Not on file  .  Transportation needs:    Medical: Not on file    Non-medical: Not on file  Tobacco Use  . Smoking status: Never Smoker  . Smokeless tobacco: Never Used  Substance and Sexual Activity  . Alcohol use: No    Alcohol/week: 0.0 oz  . Drug use: No  . Sexual activity: Not on file  Lifestyle  . Physical activity:    Days per week: Not on file    Minutes per session: Not on file  . Stress: Not on file  Relationships  . Social connections:    Talks on phone: Not on file    Gets together: Not on file    Attends religious service: Not on file    Active member of club or organization: Not on file    Attends meetings of clubs or organizations: Not on file    Relationship status: Not on file  . Intimate partner violence:    Fear of current or ex partner: Not on file    Emotionally abused: Not on file    Physically abused: Not on file    Forced sexual activity: Not on file  Other Topics Concern  . Not on file  Social History Narrative  . Not  on file    Past Surgical History:  Procedure Laterality Date  . SKIN SURGERY     secondary to hidradenitis suppurativa  . UTERINE FIBROID SURGERY      Family History  Problem Relation Age of Onset  . Hypertension Mother   . Cancer Mother 4       Ovary/Uterus  . Hypertension Father   . Thyroid disease Paternal Uncle   . Heart attack Paternal Uncle        "due to thyroid disorder"  . Diabetes Maternal Grandmother   . Cancer Paternal Grandmother        Breast  . Cancer Paternal Grandfather        Prostate    Allergies  Allergen Reactions  . Penicillins Itching  . Sulfa Antibiotics Itching  . Hydrocodone Rash  . Oxycodone-Acetaminophen Itching    Current Outpatient Medications on File Prior to Visit  Medication Sig Dispense Refill  . Fexofenadine-Pseudoephedrine (ALLEGRA-D 12 HOUR PO) Take 1 tablet by mouth 2 (two) times daily.    . GuaiFENesin (MUCINEX PO) Take 1 tablet by mouth daily.     No current facility-administered  medications on file prior to visit.     BP (!) 127/91 (BP Location: Right Arm, Cuff Size: Normal)   Pulse 74   Temp 98.3 F (36.8 C) (Oral)   Resp 18   Ht 5\' 5"  (1.651 m)   Wt 155 lb 6.4 oz (70.5 kg)   LMP 06/23/2017   SpO2 100%   BMI 25.86 kg/m       Objective:   Physical Exam  Constitutional: She is oriented to person, place, and time. She appears well-developed and well-nourished.  HENT:  Head: Normocephalic and atraumatic.  Right Ear: Tympanic membrane and ear canal normal.  Left Ear: Tympanic membrane and ear canal normal.  + tenderness to palpation overlying right temple/mandible  Eyes: Pupils are equal, round, and reactive to light. EOM are normal.  Cardiovascular: Normal rate, regular rhythm and normal heart sounds.  No murmur heard. Pulmonary/Chest: Effort normal and breath sounds normal. No respiratory distress. She has no wheezes.  Musculoskeletal: She exhibits no edema.  Neurological: She is alert and oriented to person, place, and time. No cranial nerve deficit. She exhibits normal muscle tone.  Reflex Scores:      Patellar reflexes are 3+ on the right side and 3+ on the left side. Skin: Skin is warm and dry.  Psychiatric: She has a normal mood and affect. Her behavior is normal. Judgment and thought content normal.          Assessment & Plan:  Vasovagal syncope-no further symptoms.  We will continue to monitor.  Hypokalemia-mild noted during emergency department visit.  Will check follow-up basic metabolic panel.  Anemia- we will obtain serum iron and ferritin.  Add iron supplement if low.  She reports a history of heavy menses.  Concussion- no neurological deficits.  She is having headache which is not being relieved by ibuprofen.  I did give her a prescription for tramadol to use short-term on an as-needed basis.  I have written her out of work until Monday. [place] controlled substance registry is reviewed and there is no hx of controlled substance fill.  She is advised to call if new/worsening symptoms or if symptoms do not improve in the coming weeks.

## 2017-06-23 NOTE — Patient Instructions (Addendum)
Please call your gynecologist to schedule a pap smear soon! Complete lab work prior to leaving. You may use tramadol as needed for headache. Please do not drive after taking.  I think you have suffered a concussion. Please call if new/worsening symptoms or if symptoms do not improve in the next few weeks.    Concussion, Adult A concussion is a brain injury from a direct hit (blow) to the head or body. This injury causes the brain to shake quickly back and forth inside the skull. It is caused by:  A hit to the head.  A quick and sudden movement (jolt) of the head or neck.  How fast you will get better from a concussion depends on many things like how bad your concussion was, what part of your brain was hurt, how old you are, and how healthy you were before the concussion. Recovery can take time. It is important to wait to return to activity until a doctor says it is safe and your symptoms are all gone. Follow these instructions at home: Activity  Limit activities that need a lot of thought or concentration. These include: ? Homework or work for your job. ? Watching TV. ? Computer work. ? Playing memory games and puzzles.  Rest. Rest helps the brain to heal. Make sure you: ? Get plenty of sleep at night. Do not stay up late. ? Go to bed at the same time every day. ? Rest during the day. Take naps or rest breaks when you feel tired.  It can be dangerous if you get another concussion before the first one has healed Do not do activities that could cause a second concussion, such as riding a bike or playing sports.  Ask your doctor when you can return to your normal activities, like driving, riding a bike, or using machinery. Your ability to react may be slower. Do not do these activities if you are dizzy. Your doctor will likely give you a plan for slowly going back to activities. General instructions  Take over-the-counter and prescription medicines only as told by your doctor.  Do  not drink alcohol until your doctor says you can.  If it is harder than usual to remember things, write them down.  If you are easily distracted, try to do one thing at a time. For example, do not try to watch TV while making dinner.  Talk with family members or close friends when you need to make important decisions.  Watch your symptoms and tell other people to do the same. Other problems (complications) can happen after a concussion. Older adults with a brain injury may have a higher risk of serious problems, such as a blood clot in the brain.  Tell your teachers, school nurse, school counselor, coach, Product/process development scientist, or work Freight forwarder about your injury and symptoms. Tell them about what you can or cannot do. They should watch for: ? More problems with attention or concentration. ? More trouble remembering or learning new information. ? More time needed to do tasks or assignments. ? Being more annoyed (irritable) or having a harder time dealing with stress. ? Any other symptoms that get worse.  Keep all follow-up visits as told by your health care provider. This is important. Prevention  It is very important that you donot get another brain injury, especially before you have healed. In rare cases, another injury can cause permanent brain damage, brain swelling, or death. You have the most risk if you get another head injury  in the first 7-10 days after you were hurt before. To avoid injuries: ? Wear a seat belt when you ride in a car. ? Do not drink too much alcohol. ? Avoid activities that could make you get a second concussion, like contact sports. ? Wear a helmet when you do activities like:  Biking.  Skiing.  Skateboarding.  Skating. ? Make your home safe by:  Removing things from the floor or stairs that could make you trip.  Using grab bars in bathrooms and handrails by stairs.  Placing non-slip mats on floors and in bathtubs.  Putting more light in dark  areas. Contact a doctor if:  Your symptoms get worse.  You have new symptoms.  You keep having symptoms for more than 2 weeks. Get help right away if:  You have bad headaches, or your headaches get worse.  You have weakness in any part of your body.  You have loss of feeling (numbness).  You feel off balance.  You keep throwing up (vomiting).  You feel more sleepy.  The black center of one eye (pupil) is bigger than the other one.  You twitch or shake violently (convulse) or have a seizure.  Your speech is not clear (is slurred).  You feel more tired, more confused, or more annoyed.  You do not recognize people or places.  You have neck pain.  It is hard to wake you up.  You have strange behavior changes.  You pass out (lose consciousness). Summary  A concussion is a brain injury from a direct hit (blow) to the head or body.  This condition is treated with rest and careful watching of symptoms.  If you keep having symptoms for more than 2 weeks, call your doctor. This information is not intended to replace advice given to you by your health care provider. Make sure you discuss any questions you have with your health care provider. Document Released: 02/02/2009 Document Revised: 01/30/2016 Document Reviewed: 01/30/2016 Elsevier Interactive Patient Education  2017 [employer].

## 2017-06-25 ENCOUNTER — Encounter: Payer: Self-pay | Admitting: Family

## 2017-06-25 ENCOUNTER — Telehealth: Payer: Self-pay | Admitting: Family

## 2017-06-25 DIAGNOSIS — D509 Iron deficiency anemia, unspecified: Secondary | ICD-10-CM | POA: Insufficient documentation

## 2017-06-25 HISTORY — DX: Iron deficiency anemia, unspecified: D50.9

## 2017-06-25 MED ORDER — IRON 325 (65 FE) MG PO TABS: 1.0000 | ORAL_TABLET | Freq: Two times a day (BID) | ORAL | 0 refills | Status: DC

## 2017-06-25 NOTE — Telephone Encounter (Signed)
Iron level is low and she is anemic. Please ask her to add iron 325mg  bid. Repeat cbc, serum iron, tibc in 3 months.

## 2017-06-26 ENCOUNTER — Encounter: Payer: Self-pay | Admitting: Physician Assistant

## 2017-06-26 ENCOUNTER — Other Ambulatory Visit: Payer: Self-pay

## 2017-06-26 ENCOUNTER — Ambulatory Visit: Payer: BLUE CROSS/BLUE SHIELD | Admitting: Physician Assistant

## 2017-06-26 VITALS — BP 132/82 | HR 69 | Temp 97.8°F | Resp 14 | Ht 65.0 in | Wt 156.0 lb

## 2017-06-26 DIAGNOSIS — F0781 Postconcussional syndrome: Secondary | ICD-10-CM

## 2017-06-26 DIAGNOSIS — M26609 Unspecified temporomandibular joint disorder, unspecified side: Secondary | ICD-10-CM

## 2017-06-26 MED ORDER — MELOXICAM 15 MG PO TABS: 15.0000 mg | ORAL_TABLET | Freq: Every day | ORAL | 0 refills | Status: DC

## 2017-06-26 NOTE — Telephone Encounter (Signed)
Left message for pt to return my call.

## 2017-06-26 NOTE — Progress Notes (Signed)
Patient presents to clinic today to transfer care back to our office from the [facility] location.  Patient with history of hidradenitis suppurativa and iron deficiency anemia. These have been stable per patient. Patient recently suffered a concussion s/p hitting the bathroom floor during an episode of vasovagal syncope. Was evaluated in the ER with negative workup. Since that time she has had some mild headaches and mental fogginess. Denies vision changes, nausea or vomiting. Denies ams. Has noted some R ear/jaw pain where her face bumped into a plunger handle while falling. Has been taking Tramadol at night with some relief. Taking Ibuprofen during the day with no relief in symptoms.   Past Medical History:  Diagnosis Date  . Hidradenitis suppurativa   . Iron deficiency anemia 06/25/2017    Current Outpatient Medications on File Prior to Visit  Medication Sig Dispense Refill  . Ferrous Sulfate (IRON) 325 (65 Fe) MG TABS Take 1 tablet (325 mg total) by mouth 2 (two) times daily. 30 each 0  . traMADol (ULTRAM) 50 MG tablet Take 1 tablet (50 mg total) by mouth every 8 (eight) hours as needed for up to 15 days. 30 tablet 0   No current facility-administered medications on file prior to visit.     Allergies  Allergen Reactions  . Penicillins Itching  . Sulfa Antibiotics Itching  . Hydrocodone Rash  . Oxycodone-Acetaminophen Itching    Family History  Problem Relation Age of Onset  . Hypertension Mother   . Cancer Mother 25       Ovary/Uterus  . Hypertension Father   . Thyroid disease Paternal Uncle   . Heart attack Paternal Uncle        "due to thyroid disorder"  . Diabetes Maternal Grandmother   . Cancer Paternal Grandmother        Breast  . Cancer Paternal Grandfather        Prostate    Social History   Socioeconomic History  . Marital status: Single    Spouse name: Not on file  . Number of children: Not on file  . Years of education: Not on file  . Highest education level:  Not on file  Occupational History  . Not on file  Social Needs  . Financial resource strain: Not on file  . Food insecurity:    Worry: Not on file    Inability: Not on file  . Transportation needs:    Medical: Not on file    Non-medical: Not on file  Tobacco Use  . Smoking status: Never Smoker  . Smokeless tobacco: Never Used  Substance and Sexual Activity  . Alcohol use: No    Alcohol/week: 0.0 oz  . Drug use: No  . Sexual activity: Not on file  Lifestyle  . Physical activity:    Days per week: Not on file    Minutes per session: Not on file  . Stress: Not on file  Relationships  . Social connections:    Talks on phone: Not on file    Gets together: Not on file    Attends religious service: Not on file    Active member of club or organization: Not on file    Attends meetings of clubs or organizations: Not on file    Relationship status: Not on file  Other Topics Concern  . Not on file  Social History Narrative  . Not on file   Review of Systems - See HPI.  All other ROS are negative.  BP 132/82   Pulse 69   Temp 97.8 F (36.6 C) (Oral)   Resp 14   Ht 5\' 5"  (1.651 m)   Wt 156 lb (70.8 kg)   LMP 06/23/2017   SpO2 99%   BMI 25.96 kg/m   Physical Exam  Constitutional: She appears well-developed and well-nourished.  HENT:  Head: Normocephalic and atraumatic.  Right Ear: Tympanic membrane, external ear and ear canal normal.  Left Ear: Tympanic membrane, external ear and ear canal normal.  Mouth/Throat: Oropharynx is clear and moist.  + right TMJ pain with ROM of jaw and palpation over TMJ.   Eyes: Pupils are equal, round, and reactive to light. Conjunctivae and EOM are normal.  Neck: Neck supple.  Cardiovascular: Normal rate, regular rhythm, normal heart sounds and intact distal pulses.  Pulmonary/Chest: Effort normal and breath sounds normal. No stridor. No respiratory distress. She has no wheezes. She has no rales. She exhibits no tenderness.    Lymphadenopathy:    She has no cervical adenopathy.  Psychiatric: She has a normal mood and affect.    Recent Results (from the past 2160 hour(s))  Basic metabolic panel     Status: None   Collection Time: 06/23/17  8:47 AM  Result Value Ref Range   Sodium 143 135 - 145 mEq/L   Potassium 3.8 3.5 - 5.1 mEq/L   Chloride 105 96 - 112 mEq/L   CO2 28 19 - 32 mEq/L   Glucose, Bld 99 70 - 99 mg/dL   BUN 9 6 - 23 mg/dL   Creatinine, Ser 0.80 0.40 - 1.20 mg/dL   Calcium 9.5 8.4 - 10.5 mg/dL   GFR 102.66 >60.00 mL/min  Iron     Status: Abnormal   Collection Time: 06/23/17  8:47 AM  Result Value Ref Range   Iron 26 (L) 42 - 145 ug/dL  Ferritin     Status: Abnormal   Collection Time: 06/23/17  8:47 AM  Result Value Ref Range   Ferritin 4.0 (L) 10.0 - 291.0 ng/mL    Assessment/Plan: 1. Postconcussive syndrome Discussed typical duration of postconcussive syndrome. Will have her remain out of work for the next week for mental rest. Limit bright lights and other stimuli. Limit tramadol use to prevent risk of medication-induced headache. Supportive measures reviewed. Return precautions discussed.  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 Good ROM. Start Mobic once daily with food. Icy hot topically to the area. May need to invest in a mouth guard for nighttime use for now. Follow-up if not improving.    Leeanne Rio, PA-C

## 2017-06-26 NOTE — Patient Instructions (Signed)
Please stay well-hydrated and get plenty of rest.  Use the Meloxicam once daily with food. Tylenol for mild breakthrough pain, or Tramadol for severe pain. You need to rest your mind as much as possible to help get better quicker.  Limit TV and other forms of stimuli.    Temporomandibular Joint Syndrome Temporomandibular joint (TMJ) syndrome is a condition that affects the joints between your jaw and your skull. The TMJs are located near your ears and allow your jaw to open and close. These joints and the nearby muscles are involved in all movements of the jaw. People with TMJ syndrome have pain in the area of these joints and muscles. Chewing, biting, or other movements of the jaw can be difficult or painful. TMJ syndrome can be caused by various things. In many cases, the condition is mild and goes away within a few weeks. For some people, the condition can become a long-term problem. What are the causes? Possible causes of TMJ syndrome include:  Grinding your teeth or clenching your jaw. Some people do this when they are under stress.  Arthritis.  Injury to the jaw.  Head or neck injury.  Teeth or dentures that are not aligned well.  In some cases, the cause of TMJ syndrome may not be known. What are the signs or symptoms? The most common symptom is an aching pain on the side of the head in the area of the TMJ. Other symptoms may include:  Pain when moving your jaw, such as when chewing or biting.  Being unable to open your jaw all the way.  Making a clicking sound when you open your mouth.  Headache.  Earache.  Neck or shoulder pain.  How is this diagnosed? Diagnosis can usually be made based on your symptoms, your medical history, and a physical exam. Your health care provider may check the range of motion of your jaw. Imaging tests, such as X-rays or an MRI, are sometimes done. You may need to see your dentist to determine if your teeth and jaw are lined up  correctly. How is this treated? TMJ syndrome often goes away on its own. If treatment is needed, the options may include:  Eating soft foods and applying ice or heat.  Medicines to relieve pain or inflammation.  Medicines to relax the muscles.  A splint, bite plate, or mouthpiece to prevent teeth grinding or jaw clenching.  Relaxation techniques or counseling to help reduce stress.  Transcutaneous electrical nerve stimulation (TENS). This helps to relieve pain by applying an electrical current through the skin.  Acupuncture. This is sometimes helpful to relieve pain.  Jaw surgery. This is rarely needed.  Follow these instructions at home:  Take medicines only as directed by your health care provider.  Eat a soft diet if you are having trouble chewing.  Apply ice to the painful area. ? Put ice in a plastic bag. ? Place a towel between your skin and the bag. ? Leave the ice on for 20 minutes, 2-3 times a day.  Apply a warm compress to the painful area as directed.  Massage your jaw area and perform any jaw stretching exercises as recommended by your health care provider.  If you were given a mouthpiece or bite plate, wear it as directed.  Avoid foods that require a lot of chewing. Do not chew gum.  Keep all follow-up visits as directed by your health care provider. This is important. Contact a health care provider if:  You are  having trouble eating.  You have new or worsening symptoms. Get help right away if:  Your jaw locks open or closed. This information is not intended to replace advice given to you by your health care provider. Make sure you discuss any questions you have with your health care provider. Document Released: 11/09/2000 Document Revised: 10/15/2015 Document Reviewed: 09/19/2013 Elsevier Interactive Patient Education  2018 [place] Syndrome Post-concussion syndrome is the symptoms that can occur after a head injury. These  symptoms can last from weeks to months. Follow these instructions at home:  Take medicines only as told by your doctor.  Do not take aspirin.  Sleep with your head raised to help with headaches.  Avoid activities that can cause another head injury. ? Do not play contact sports like football, hockey, soccer, or basketball. ? Do not do other risky activities like downhill skiing, martial arts, or horseback riding until your doctor says it is okay.  Keep all follow-up visits as told by your doctor. This is important. Contact a doctor if:  You have a harder time: ? Paying attention. ? Focusing. ? Remembering. ? Learning new information. ? Dealing with stress.  You need more time to complete tasks.  You are easily bothered (irritable).  You have more symptoms. Get help if you have any of these symptoms for more than two weeks after your injury:  Long-lasting (chronic) headaches.  Dizziness.  Trouble balancing.  Feeling sick to your stomach (nauseous).  Trouble with your vision.  Noise or light bothers you more.  Depression.  Mood swings.  Feeling worried (anxious).  Easily bothered.  Memory problems.  Trouble concentrating or paying attention.  Sleep problems.  Feeling tired all of the time.  Get help right away if:  You feel confused.  You feel very sleepy.  You are hard to wake up.  You feel sick to your stomach.  You keep throwing up (vomiting).  You feel like you are moving when you are not (vertigo).  Your eyes move back and forth very quickly.  You start shaking (convulsing) or pass out (faint).  You have very bad headaches that do not get better with medicine.  You cannot use your arms or legs like normal.  One of the black centers of your eyes (pupils) is bigger than the other.  You have clear or bloody fluid coming from your nose or ears.  Your problems get worse, not better. This information is not intended to replace advice  given to you by your health care provider. Make sure you discuss any questions you have with your health care provider. Document Released: 03/24/2004 Document Revised: 07/23/2015 Document Reviewed: 05/22/2013 Elsevier Interactive Patient Education  2018 [employer].

## 2017-06-30 NOTE — Telephone Encounter (Signed)
Left message for pt to return my call.

## 2017-07-03 ENCOUNTER — Telehealth: Payer: Self-pay | Admitting: Physician Assistant

## 2017-07-03 NOTE — Telephone Encounter (Signed)
Paperwork faxed for [employer]

## 2017-07-03 NOTE — Telephone Encounter (Signed)
FMLA paperwork in PCP folder

## 2017-07-03 NOTE — Telephone Encounter (Signed)
Received FMLA paperwork, via fax. Cover sheet states that "one copy if for the condition HS and the second copy is for the passing out/concussion episode, dates 06/19/17-07/03/17. Placed in basket with charge sheet.

## 2017-07-03 NOTE — Telephone Encounter (Signed)
FMLA complete. Given to [place] for fax.

## 2017-07-03 NOTE — Telephone Encounter (Signed)
Patient calling and is wanting to know if Elyn Aquas is wanting her to return to work this week for partial days or if he would like her to return full time due to her concussion? Also, wanted to inform him that she was approved for FMLA for 5/26.  CB#: [phone]

## 2017-07-03 NOTE — Telephone Encounter (Signed)
FMLA paperwork received. I am working on this and will send a message once completed and ready to fax. In regards to return to work I would recommend returning to work this week starting tomorrow 07/04/17 for 4 hours work days until Friday. After this she is cleared to return to full-time without any restrictions. Ok to write note regarding this if needed.

## 2017-07-03 NOTE — Telephone Encounter (Signed)
Sent mychart message

## 2017-07-04 ENCOUNTER — Telehealth: Payer: Self-pay | Admitting: Physician Assistant

## 2017-07-04 NOTE — Telephone Encounter (Signed)
  Patient is released to return to work this week as noted on FMLA. She is returning part time with 4 hours per day max through Friday. After that she is back full time with no restrictions.   Copied from [place]. Topic: Inquiry >> Jul 04, 2017 12:44 PM Vernona Rieger wrote: Reason for CRM: Patient said she faxed some fmla paperwork yesterday to [facility]. She is wanting to know if he is releasing her to return to work this week part time. Please advise Call back is [phone]

## 2017-07-04 NOTE — Telephone Encounter (Signed)
Spoke with patient. She is aware.  Stated verbal understanding.   Patient states the FMLA paperwork for the Chronic Condition dates back to March 26th 2019. She asked that I let the provider know this.

## 2017-07-04 NOTE — Telephone Encounter (Signed)
Pt calling back and states that the FMLA paperwork needs to have the date of 06/19/17 instead of 05/23/17. She states that she only had 17 days for the paperwork to be sent in and today is the 16th day. She is aware that she didn't bring paper work in until yesterday but would like to see if this could be expedited.

## 2017-07-04 NOTE — Telephone Encounter (Signed)
Will adjust. Thank you for making [place] aware.

## 2017-07-05 ENCOUNTER — Telehealth: Payer: Self-pay | Admitting: Physician Assistant

## 2017-07-05 NOTE — Telephone Encounter (Signed)
Paperwork completed and faxed in. Please inform patient.

## 2017-07-05 NOTE — Telephone Encounter (Signed)
Received "[employer] - Return to Work Form" via fax. Requesting to be completed and faxed to [employer]. Placed in front bin with charge sheet.

## 2017-07-06 ENCOUNTER — Ambulatory Visit: Payer: Self-pay | Admitting: *Deleted

## 2017-07-06 NOTE — Telephone Encounter (Signed)
Patient had fainting episode and she has had pain in R temporal area since. Patient stopped Tramadol because of SE- patient was given antiinflammatory - she reports she has had improvement in jaw pain but she is feeling pain and pressure in eye area- especially when she is sitting and standing.Patient states she has light sensitivity in her R eye. Call to office- they are fine with seeing patient in the office today or having her go to ED for evaluation. [facility]- she is going to go there.  Reason for Disposition . Concussion symptoms are worsening  Answer Assessment - Initial Assessment Questions 1. MECHANISM: "How did the injury happen?" For falls, ask: "What height did you fall from?" and "What surface did you fall against?"      Fainted and patient fell- patient was sitting- wooden handle and floor- wood floor 2. ONSET: "When did the injury happen?" (Minutes or hours ago)      4/23 3. NEUROLOGIC SYMPTOMS: "Was there any loss of consciousness?" "Are there any other neurological symptoms?"      Yes- pain at R temple, jaw pain, ear pain eye pain 4. MENTAL STATUS: "Does the person know who he is, who you are, and where he is?"      Mental status is aware now- patient had depressed mental status the first week and 1/2- it has improved 5. LOCATION: "What part of the head was hit?"      R part of head 6. SCALP APPEARANCE: "What does the scalp look like? Is it bleeding now?" If so, ask: "Is it difficult to stop?"      Cut above eye- small laceration that healed 7. SIZE: For cuts, bruises, or swelling, ask: "How large is it?" (e.g., inches or centimeters)      No bruising or swelling visiable 8. PAIN: "Is there any pain?" If so, ask: "How bad is it?"  (e.g., Scale 1-10; or mild, moderate, severe)     Pain patient had is present in temple area and in ear 9. TETANUS: For any breaks in the skin, ask: "When was the last tetanus booster?"      Unknown 10. OTHER SYMPTOMS: "Do you have any other symptoms?" (e.g., neck pain, vomiting)       No- residual pressure in eye and temple 11. PREGNANCY: "Is there any chance you are pregnant?" "When was your last menstrual period?"       No- LMP- 06/19/17  Protocols used: CONCUSSION ([facility]) LESS THAN 14 DAYS AGO FOLLOW-UP CALL-A-AH, HEAD INJURY-A-AH

## 2017-07-06 NOTE — Telephone Encounter (Signed)
PT aware

## 2017-07-07 ENCOUNTER — Telehealth: Payer: Self-pay | Admitting: Physician Assistant

## 2017-07-07 NOTE — Telephone Encounter (Signed)
Received paperwork from [facility], via fax. Requesting that forms be filled out and that additional information be furnished to them. Placed in front bin with charge sheet.

## 2017-07-07 NOTE — Telephone Encounter (Signed)
Paperwork placed in green folder for [employer].

## 2017-07-13 NOTE — Telephone Encounter (Signed)
Pt called to inform pcp of the applications: -Line 14 need to be filled out; the intermittent field has to have the date of the initial visit and future dated visit  - pt has a question about an end date on the form  -the block weeks pt was out due to concussion needs to show a regimen   Call pt to clarify if needed

## 2017-07-14 NOTE — Telephone Encounter (Signed)
Spoke with patient about explanation of FMLA paperwork. Patient states Line 14 on concussion paperwork needed last visit date and future dates if needed On paperwork on reoccurring FMLA she had questions about dates. Usually 1 year since last years paperwork. And how frequent missed days of episodes. For line #14 on concussion paperwork needs regimen (medications given by providers) Paperwork addend and re-fax

## 2017-07-14 NOTE — Telephone Encounter (Signed)
Line 14 has already been completed. I will add to this. Please call patient to assess her other questions. Not sure why this was not already done by the agent who took the call.

## 2017-07-21 ENCOUNTER — Encounter: Payer: Self-pay | Admitting: Physician Assistant

## 2017-07-21 ENCOUNTER — Ambulatory Visit: Payer: BLUE CROSS/BLUE SHIELD | Admitting: Physician Assistant

## 2017-07-21 ENCOUNTER — Other Ambulatory Visit: Payer: Self-pay

## 2017-07-21 VITALS — BP 112/78 | HR 71 | Temp 97.6°F | Resp 16 | Ht 65.0 in | Wt 153.6 lb

## 2017-07-21 DIAGNOSIS — L732 Hidradenitis suppurativa: Secondary | ICD-10-CM | POA: Diagnosis not present

## 2017-07-21 MED ORDER — TRAMADOL HCL 50 MG PO TABS: 50.0000 mg | ORAL_TABLET | Freq: Three times a day (TID) | ORAL | 0 refills | Status: DC | PRN

## 2017-07-21 MED ORDER — FLUCONAZOLE 150 MG PO TABS: 150.0000 mg | ORAL_TABLET | Freq: Once | ORAL | 1 refills | Status: AC

## 2017-07-21 MED ORDER — IBUPROFEN 800 MG PO TABS: 800.0000 mg | ORAL_TABLET | Freq: Three times a day (TID) | ORAL | 0 refills | Status: DC | PRN

## 2017-07-21 MED ORDER — CLINDAMYCIN PHOSPHATE 1 % EX GEL: Freq: Two times a day (BID) | CUTANEOUS | 0 refills | Status: DC

## 2017-07-21 MED ORDER — DOXYCYCLINE HYCLATE 100 MG PO CAPS: 100.0000 mg | ORAL_CAPSULE | Freq: Two times a day (BID) | ORAL | 0 refills | Status: DC

## 2017-07-21 NOTE — Progress Notes (Signed)
Patient with history of significant hidradenitis suppurativa presents to clinic today c/o exacerbation of her hidradenitis x 3 days. Notes 3-4 painful lesions in R inguinal region causing significant pain. Pain has been 10/10 but improved today to 7/10 as the areas have started draining on their own. Denies fever, chills. Is not currently seeing a specialist as she states that people always want to cut on her.   Past Medical History:  Diagnosis Date  . Hidradenitis suppurativa   . Iron deficiency anemia 06/25/2017    No current outpatient medications on file prior to visit.   No current facility-administered medications on file prior to visit.     Allergies  Allergen Reactions  . Penicillins Itching  . Sulfa Antibiotics Itching  . Hydrocodone Rash  . Oxycodone-Acetaminophen Itching    Family History  Problem Relation Age of Onset  . Hypertension Mother   . Cancer Mother 58       Ovary/Uterus  . Hypertension Father   . Thyroid disease Paternal Uncle   . Heart attack Paternal Uncle        "due to thyroid disorder"  . Diabetes Maternal Grandmother   . Cancer Paternal Grandmother        Breast  . Cancer Paternal Grandfather        Prostate    Social History   Socioeconomic History  . Marital status: Single    Spouse name: Not on file  . Number of children: Not on file  . Years of education: Not on file  . Highest education level: Not on file  Occupational History  . Not on file  Social Needs  . Financial resource strain: Not on file  . Food insecurity:    Worry: Not on file    Inability: Not on file  . Transportation needs:    Medical: Not on file    Non-medical: Not on file  Tobacco Use  . Smoking status: Never Smoker  . Smokeless tobacco: Never Used  Substance and Sexual Activity  . Alcohol use: No    Alcohol/week: 0.0 oz  . Drug use: No  . Sexual activity: Not on file  Lifestyle  . Physical activity:    Days per week: Not on file    Minutes per  session: Not on file  . Stress: Not on file  Relationships  . Social connections:    Talks on phone: Not on file    Gets together: Not on file    Attends religious service: Not on file    Active member of club or organization: Not on file    Attends meetings of clubs or organizations: Not on file    Relationship status: Not on file  Other Topics Concern  . Not on file  Social History Narrative  . Not on file   Review of Systems - See HPI.  All other ROS are negative.  BP 112/78   Pulse 71   Temp 97.6 F (36.4 C) (Oral)   Resp 16   Ht 5\' 5"  (1.651 m)   Wt 153 lb 9.6 oz (69.7 kg)   LMP 06/23/2017   SpO2 95%   BMI 25.56 kg/m   Physical Exam  Constitutional: She appears well-developed and well-nourished.  HENT:  Head: Normocephalic and atraumatic.  Cardiovascular: Normal rate, regular rhythm and normal heart sounds.  Pulmonary/Chest: Effort normal.  Skin:     Vitals reviewed.   Recent Results (from the past 2160 hour(s))  Basic metabolic panel  Status: None   Collection Time: 06/23/17  8:47 AM  Result Value Ref Range   Sodium 143 135 - 145 mEq/L   Potassium 3.8 3.5 - 5.1 mEq/L   Chloride 105 96 - 112 mEq/L   CO2 28 19 - 32 mEq/L   Glucose, Bld 99 70 - 99 mg/dL   BUN 9 6 - 23 mg/dL   Creatinine, Ser 0.80 0.40 - 1.20 mg/dL   Calcium 9.5 8.4 - 10.5 mg/dL   GFR 102.66 >60.00 mL/min  Iron     Status: Abnormal   Collection Time: 06/23/17  8:47 AM  Result Value Ref Range   Iron 26 (L) 42 - 145 ug/dL  Ferritin     Status: Abnormal   Collection Time: 06/23/17  8:47 AM  Result Value Ref Range   Ferritin 4.0 (L) 10.0 - 291.0 ng/mL    Assessment/Plan: Hidradenitis suppurativa Current exacerbation. Areas are draining on their own. Some erythema around one of the larger lesions of R inguinal region. Significant tenderness. Start Doxycycline. Ibuprofen 800 mg and Tramadol for pain (allergic to oxycodone). Start Clindamycin topical once exacerbation is over. Referring  to specialist at [facility] for further management giving recurrent issues.   Leeanne Rio, PA-C

## 2017-07-21 NOTE — Assessment & Plan Note (Signed)
Current exacerbation. Areas are draining on their own. Some erythema around one of the larger lesions of R inguinal region. Significant tenderness. Start Doxycycline. Ibuprofen 800 mg and Tramadol for pain (allergic to oxycodone). Start Clindamycin topical once exacerbation is over. Referring to specialist at [facility] for further management giving recurrent issues.

## 2017-07-21 NOTE — Patient Instructions (Signed)
Please stay well-hydrated and get plenty of rest.  Use the Ibuprofen as directed along with the Tramadol for severe pain.  Keep skin clean and dry. Take the Doxycycline as directed along with a probiotic.  I am working on getting you set up with the specialist at [facility]. Keep your phone on as you should get a phone call in the next week.  Once this exacerbation has resolved, start the Clindamycin gel as directed. Use an antibiotic wash 2 x week.   If you note worsening symptoms over the weekend or develop a fever, please go to the ER.

## 2017-07-24 ENCOUNTER — Other Ambulatory Visit: Payer: Self-pay | Admitting: Physician Assistant

## 2017-07-25 ENCOUNTER — Ambulatory Visit: Payer: Self-pay | Admitting: Physician Assistant

## 2017-07-28 ENCOUNTER — Telehealth: Payer: Self-pay | Admitting: Emergency Medicine

## 2017-07-28 ENCOUNTER — Encounter: Payer: Self-pay | Admitting: Emergency Medicine

## 2017-07-28 NOTE — Telephone Encounter (Signed)
Ok to send in.  

## 2017-07-28 NOTE — Telephone Encounter (Signed)
Paperwork and letter re-faxed to provided number. Will check on status of paperwork on Monday. Received confirmation of fax.

## 2017-07-28 NOTE — Telephone Encounter (Signed)
Spoke with patient about FMLA paperwork. She was advised paperwork was faxed on 07/21/17. Will refax to provided number. Also patient states if we could fax over letterhead letter stating patient was seen in the office on 06/26/17 for concussion and 07/21/17 for hidradenitis suppurativa and fax document to provided number as well.  Is this ok to complete and fax today  Copied from [place] [id number]. Topic: General - Other >> Jul 28, 2017  3:06 PM Yvette Rack wrote: Reason for CRM: Pt states her FMLA paperwork deadline is today 07/28/17 and she needs to know if it was completed and faxed. Pt requesting a return call. Cb# [phone]

## 2017-09-27 ENCOUNTER — Encounter: Payer: Self-pay | Admitting: Physician Assistant

## 2017-10-02 ENCOUNTER — Ambulatory Visit: Payer: Self-pay | Admitting: *Deleted

## 2017-10-02 NOTE — Telephone Encounter (Signed)
Pt called with of chest/back pain starting 09/22/17 which she thought was gas, it has persistent and worsening since 09/25/17 and rash which started; she stated that she went to urgent care on 09/27/17 and was started on antiviral medication because she may have shingles, but has no relief in symptoms; she describes the rash as red, raised; the pt states that she is having pain along the same path that the rash is following; she has rash on face, hands, forearms, back, and chest; she has been given tramadol for pain; the pt states that she had a test for chicken pox while at urgent care and it was negative; the patient states that she has been taking ultram and motrin which has relieved her pain; recommendations made per nurse triage protocol to include seeing a physician within 24 hours; pt offered and accepted an appointment with Elyn Aquas, [place], 10/03/17 at 1315; she verbalizes understanding; will route to office for notification of this upcoming appointment.      Reason for Disposition . SEVERE itching (i.e., interferes with sleep, normal activities or school)  Answer Assessment - Initial Assessment Questions 1. APPEARANCE of RASH: "Describe the rash." (e.g., spots, blisters, raised areas, skin peeling, scaly)     Raised red 2. SIZE: "How big are the spots?" (e.g., tip of pen, eraser, coin; inches, centimeters)     Size of a pen head 3. LOCATION: "Where is the rash located?"     Chest. Back, forearm, face, hands 4. COLOR: "What color is the rash?" (Note: It is difficult to assess rash color in people with darker-colored skin. When this situation occurs, simply ask the caller to describe what they see.)     red 5. ONSET: "When did the rash begin?"     09/22/17 6. FEVER: "Do you have a fever?" If so, ask: "What is your temperature, how was it measured, and when did it start?"     no 7. ITCHING: "Does the rash itch?" If so, ask: "How bad is the itch?" (Scale 1-10; or mild, moderate,  severe)     Mild to moderate 8. CAUSE: "What do you think is causing the rash?" unsure 9. MEDICATION FACTORS: "Have you started any new medications within the last 2 weeks?" (e.g., antibiotics)      Given acyclovir 3times daily since 09/27/17  10. OTHER SYMPTOMS: "Do you have any other symptoms?" (e.g., dizziness, headache, sore throat, joint pain)      Previously,the week before had temp of 100.4, fatigue, chills 11. PREGNANCY: "Is there any chance you are pregnant?" "When was your last menstrual period?"       No 09/11/17  Protocols used: RASH OR REDNESS - [facility]

## 2017-10-03 ENCOUNTER — Encounter: Payer: Self-pay | Admitting: Physician Assistant

## 2017-10-03 ENCOUNTER — Other Ambulatory Visit: Payer: Self-pay

## 2017-10-03 ENCOUNTER — Ambulatory Visit: Payer: BLUE CROSS/BLUE SHIELD | Admitting: Physician Assistant

## 2017-10-03 VITALS — BP 132/86 | HR 94 | Temp 98.7°F | Resp 17 | Ht 65.0 in | Wt 152.6 lb

## 2017-10-03 DIAGNOSIS — B029 Zoster without complications: Secondary | ICD-10-CM

## 2017-10-03 MED ORDER — GABAPENTIN 100 MG PO CAPS: 100.0000 mg | ORAL_CAPSULE | Freq: Two times a day (BID) | ORAL | 0 refills | Status: DC

## 2017-10-03 MED ORDER — LIDOCAINE 5 % EX OINT: 1.0000 "application " | TOPICAL_OINTMENT | CUTANEOUS | 0 refills | Status: DC | PRN

## 2017-10-03 NOTE — Progress Notes (Signed)
Patient presents to clinic today c/o continued painful and pruritic blistering rash of her left side, present for > 1 week. Was seen at [facility], diagnosed with shingles and started on 7-day course of valtrex. Noted a worsening of rash for a couple days after starting medication but this has leveled off. Denies current blistering. Notes significant burning pain. Denies fever, chills, N/V, malaise. Is taking Tramadol which helps some with pain.  Past Medical History:  Diagnosis Date  . Hidradenitis suppurativa   . Iron deficiency anemia 06/25/2017    Current Outpatient Medications on File Prior to Visit  Medication Sig Dispense Refill  . ibuprofen (ADVIL,MOTRIN) 800 MG tablet Take 1 tablet (800 mg total) by mouth every 8 (eight) hours as needed. 30 tablet 0  . traMADol (ULTRAM) 50 MG tablet Take 1 tablet (50 mg total) by mouth every 8 (eight) hours as needed. 30 tablet 0  . valACYclovir (VALTREX) 1000 MG tablet TAKE ONE TABLET (1,000 MG DOSE) BY MOUTH 3 (THREE) TIMES A DAY FOR 7 DAYS.  0   No current facility-administered medications on file prior to visit.     Allergies  Allergen Reactions  . Penicillins Itching  . Sulfa Antibiotics Itching  . Hydrocodone Rash  . Oxycodone-Acetaminophen Itching    Family History  Problem Relation Age of Onset  . Hypertension Mother   . Cancer Mother 15       Ovary/Uterus  . Hypertension Father   . Thyroid disease Paternal Uncle   . Heart attack Paternal Uncle        "due to thyroid disorder"  . Diabetes Maternal Grandmother   . Cancer Paternal Grandmother        Breast  . Cancer Paternal Grandfather        Prostate    Social History   Socioeconomic History  . Marital status: Single    Spouse name: Not on file  . Number of children: Not on file  . Years of education: Not on file  . Highest education level: Not on file  Occupational History  . Not on file  Social Needs  . Financial resource strain: Not on file  . Food insecurity:   Worry: Not on file    Inability: Not on file  . Transportation needs:    Medical: Not on file    Non-medical: Not on file  Tobacco Use  . Smoking status: Never Smoker  . Smokeless tobacco: Never Used  Substance and Sexual Activity  . Alcohol use: No    Alcohol/week: 0.0 oz  . Drug use: No  . Sexual activity: Not Currently  Lifestyle  . Physical activity:    Days per week: Not on file    Minutes per session: Not on file  . Stress: Not on file  Relationships  . Social connections:    Talks on phone: Not on file    Gets together: Not on file    Attends religious service: Not on file    Active member of club or organization: Not on file    Attends meetings of clubs or organizations: Not on file    Relationship status: Not on file  Other Topics Concern  . Not on file  Social History Narrative  . Not on file   Review of Systems - See HPI.  All other ROS are negative.  BP 132/86   Pulse 94   Temp 98.7 F (37.1 C) (Oral)   Resp 17   Ht 5\' 5"  (1.651 m)  Wt 152 lb 9.6 oz (69.2 kg)   SpO2 98%   BMI 25.39 kg/m   Physical Exam  Constitutional: She appears well-developed and well-nourished.  HENT:  Head: Normocephalic and atraumatic.  Neck: Neck supple.  Cardiovascular: Normal rate, regular rhythm, normal heart sounds and intact distal pulses.  Pulmonary/Chest: Effort normal and breath sounds normal.  Skin:     Psychiatric: She has a normal mood and affect.  Vitals reviewed.   Assessment/Plan: 1. Herpes zoster without complication Encouraged patient to complete entire course of the Valtrex. Due to delay onset of Rx, there is concern for the possibility of post-herpetic neuralgia. Start Gabapentin and lidocaine ointment. Supportive measures discussed. Follow-up 1 week for reassessment.    Leeanne Rio, PA-C

## 2017-10-03 NOTE — Patient Instructions (Signed)
Please finish the entire course of the Valtrex. Take the Gabapentin as directed. If tolerating well after the first couple of days, can increase the evening dose to 3 capsules (300 mg).  Apply the lidocaine ointment to the area to help numb.  Can use Tramadol as directed if needed for breakthrough pain.  For the spot on the hand and face, these are not shingles. Apply a witch hazel astringent to the area 1-2 x daily to see if this helps. Can also get some cortisone cream to try.   Follow-up 1 week for reassessment.

## 2017-10-10 ENCOUNTER — Ambulatory Visit (INDEPENDENT_AMBULATORY_CARE_PROVIDER_SITE_OTHER): Payer: BLUE CROSS/BLUE SHIELD | Admitting: Physician Assistant

## 2017-10-10 ENCOUNTER — Encounter: Payer: Self-pay | Admitting: Physician Assistant

## 2017-10-10 ENCOUNTER — Other Ambulatory Visit: Payer: Self-pay

## 2017-10-10 VITALS — BP 120/80 | HR 101 | Temp 98.3°F | Resp 16 | Ht 65.0 in | Wt 152.8 lb

## 2017-10-10 DIAGNOSIS — B0229 Other postherpetic nervous system involvement: Secondary | ICD-10-CM

## 2017-10-10 NOTE — Patient Instructions (Signed)
Please increase Gabapentin to 300 mg each morning, 100 mg noon and 300 mg each evening. Let me know how this is working over the next few days, so we can adjust further or send in refills. I am setting you up with Dermatology.

## 2017-10-10 NOTE — Progress Notes (Signed)
Patient presents to clinic today for follow-up of shingles rash and likely post-herpetic neuralgia. Patient endorses taking the Gabapentin as directed and tolerating well. Notes some improvement in pain. States rash is improved, still darker areas present but no blistering. Is having a hard time dealing with the ongoing pain. Is unable to wear a bra or regular fitting clothes currently. Denies fever, chills, malaise or fatigue.   Past Medical History:  Diagnosis Date  . Hidradenitis suppurativa   . Iron deficiency anemia 06/25/2017    Current Outpatient Medications on File Prior to Visit  Medication Sig Dispense Refill  . gabapentin (NEURONTIN) 100 MG capsule Take 1 capsule (100 mg total) by mouth 2 (two) times daily. 60 capsule 0  . ibuprofen (ADVIL,MOTRIN) 800 MG tablet Take 1 tablet (800 mg total) by mouth every 8 (eight) hours as needed. 30 tablet 0  . lidocaine (XYLOCAINE) 5 % ointment Apply 1 application topically as needed. 35.44 g 0  . traMADol (ULTRAM) 50 MG tablet Take 1 tablet (50 mg total) by mouth every 8 (eight) hours as needed. 30 tablet 0  . valACYclovir (VALTREX) 1000 MG tablet TAKE ONE TABLET (1,000 MG DOSE) BY MOUTH 3 (THREE) TIMES A DAY FOR 7 DAYS.  0   No current facility-administered medications on file prior to visit.     Allergies  Allergen Reactions  . Penicillins Itching  . Sulfa Antibiotics Itching  . Hydrocodone Rash  . Oxycodone-Acetaminophen Itching    Family History  Problem Relation Age of Onset  . Hypertension Mother   . Cancer Mother 10       Ovary/Uterus  . Hypertension Father   . Thyroid disease Paternal Uncle   . Heart attack Paternal Uncle        "due to thyroid disorder"  . Diabetes Maternal Grandmother   . Cancer Paternal Grandmother        Breast  . Cancer Paternal Grandfather        Prostate    Social History   Socioeconomic History  . Marital status: Single    Spouse name: Not on file  . Number of children: Not on file  .  Years of education: Not on file  . Highest education level: Not on file  Occupational History  . Not on file  Social Needs  . Financial resource strain: Not on file  . Food insecurity:    Worry: Not on file    Inability: Not on file  . Transportation needs:    Medical: Not on file    Non-medical: Not on file  Tobacco Use  . Smoking status: Never Smoker  . Smokeless tobacco: Never Used  Substance and Sexual Activity  . Alcohol use: No    Alcohol/week: 0.0 standard drinks  . Drug use: No  . Sexual activity: Not Currently  Lifestyle  . Physical activity:    Days per week: Not on file    Minutes per session: Not on file  . Stress: Not on file  Relationships  . Social connections:    Talks on phone: Not on file    Gets together: Not on file    Attends religious service: Not on file    Active member of club or organization: Not on file    Attends meetings of clubs or organizations: Not on file    Relationship status: Not on file  Other Topics Concern  . Not on file  Social History Narrative  . Not on file   Review of  Systems - See HPI.  All other ROS are negative.  BP 120/80   Pulse (!) 101   Temp 98.3 F (36.8 C) (Oral)   Resp 16   Ht 5\' 5"  (1.651 m)   Wt 152 lb 12.8 oz (69.3 kg)   SpO2 98%   BMI 25.43 kg/m   Physical Exam  Constitutional: She appears well-developed and well-nourished.  HENT:  Head: Normocephalic and atraumatic.  Eyes: Conjunctivae are normal.  Neck: Neck supple.  Cardiovascular: Normal rate, regular rhythm, normal heart sounds and intact distal pulses.  Pulmonary/Chest: Effort normal and breath sounds normal.  Skin:     Psychiatric: She has a normal mood and affect.  Vitals reviewed.    Assessment/Plan: 1. Postherpetic neuralgia Improved slightly. Increase Gabapentin to 300 mg AM, 100 mg Noon and 300 mg each evening. Supportive measures and OTC medications reviewed.    Leeanne Rio, PA-C

## 2017-10-11 ENCOUNTER — Telehealth: Payer: Self-pay | Admitting: Physician Assistant

## 2017-10-11 NOTE — Telephone Encounter (Signed)
Please let patient know that her FMLA and Return to Work forms are completed and ready to pick up. Would she like for [place] to also fax?

## 2017-10-12 NOTE — Telephone Encounter (Signed)
Called and spoke to pt to advise forms had been completed and faxed.

## 2017-10-19 ENCOUNTER — Telehealth: Payer: Self-pay | Admitting: Emergency Medicine

## 2017-10-19 NOTE — Telephone Encounter (Signed)
Copied from [place] [id number]. Topic: General - Other >> Oct 19, 2017  3:08 PM Yvette Rack wrote: Reason for CRM: pt calling stating that the benefit department from her job sent in short term disability form on the 16th and wanted to know if you had received it please call [place] at [phone]

## 2017-10-20 NOTE — Telephone Encounter (Signed)
Advised patient that we have not received the Short term disability forms from [place]. PCP completed FMLA and return to work paperwork given at office visit. Patient is calling her [employer] for forms to be faxed over to be completed.

## 2017-10-20 NOTE — Telephone Encounter (Signed)
I have not received anything. Only the forms she gave me at visit which were completed.

## 2017-10-23 NOTE — Telephone Encounter (Signed)
Received paperwork on Friday. PCP completed paperwork and verified with patient the days she was out of work. July 31st thru Aug 19. Paperwork faxed back

## 2017-10-25 ENCOUNTER — Other Ambulatory Visit: Payer: Self-pay | Admitting: Physician Assistant

## 2017-10-26 ENCOUNTER — Other Ambulatory Visit: Payer: Self-pay | Admitting: Physician Assistant

## 2017-10-26 DIAGNOSIS — L732 Hidradenitis suppurativa: Secondary | ICD-10-CM

## 2017-10-27 MED ORDER — IBUPROFEN 800 MG PO TABS: 800.0000 mg | ORAL_TABLET | Freq: Three times a day (TID) | ORAL | 0 refills | Status: DC | PRN

## 2017-10-27 MED ORDER — GABAPENTIN 100 MG PO CAPS: 100.0000 mg | ORAL_CAPSULE | Freq: Two times a day (BID) | ORAL | 0 refills | Status: DC

## 2018-06-12 ENCOUNTER — Telehealth: Payer: Self-pay | Admitting: Emergency Medicine

## 2018-06-12 NOTE — Telephone Encounter (Signed)
LMOVM advising patient to update chart Recent pap smear or if she sees a GYN. We can schedule a Virtual visit if need any current problems to address.

## 2018-06-26 ENCOUNTER — Encounter: Payer: Self-pay | Admitting: Physician Assistant

## 2019-02-11 ENCOUNTER — Encounter: Payer: Self-pay | Admitting: Physician Assistant

## 2019-02-11 ENCOUNTER — Ambulatory Visit (INDEPENDENT_AMBULATORY_CARE_PROVIDER_SITE_OTHER): Payer: BC Managed Care – PPO | Admitting: Physician Assistant

## 2019-02-11 ENCOUNTER — Other Ambulatory Visit: Payer: Self-pay

## 2019-02-11 DIAGNOSIS — L732 Hidradenitis suppurativa: Secondary | ICD-10-CM | POA: Diagnosis not present

## 2019-02-11 MED ORDER — DOXYCYCLINE HYCLATE 100 MG PO CAPS: 100.0000 mg | ORAL_CAPSULE | Freq: Two times a day (BID) | ORAL | 0 refills | Status: DC

## 2019-02-11 MED ORDER — IBUPROFEN 800 MG PO TABS: 800.0000 mg | ORAL_TABLET | Freq: Three times a day (TID) | ORAL | 0 refills | Status: DC | PRN

## 2019-02-11 NOTE — Patient Instructions (Signed)
Instructions sent to my chart.  Please keep skin clean and dry. Try to wear breathable undergarments (cotton). If you note recurrence of pus, increased redness or tenderness, please start the doxycycline and take as directed. Alternate ibuprofen 800 mg (prescription is sent to your pharmacy) and OTC Tylenol for pain. As discussed, call the specialist to see if they can put you on a waiting list.  Please follow-up in office with me for any new or worsening symptoms. If you develop new onset fever, please be seen at closest urgent care or ER.  I will keep an eye out for your work forms. I will complete as soon as possible.  Hang in there!

## 2019-02-11 NOTE — Progress Notes (Signed)
I have discussed the procedure for the virtual visit with the patient who has given consent to proceed with assessment and treatment.   Lyndzee Kliebert S Mataio Mele, CMA     

## 2019-02-11 NOTE — Progress Notes (Signed)
Virtual Visit via Video   I connected with patient on 02/11/19 at 11:30 AM EST by a video enabled telemedicine application and verified that I am speaking with the correct person using two identifiers.  Location patient: Home Location provider: [facility], Office Persons participating in the virtual visit: Patient, Provider, [place] (Patina Moore)  I discussed the limitations of evaluation and management by telemedicine and the availability of in person appointments. The patient expressed understanding and agreed to proceed.  Subjective:   HPI:   Patient presents via [facility] today complaining of flareup of her hidradenitis over the past 3 weeks.  Patient endorses current areas of concern in the gluteal region.  Notes 3 chronic cysts now with inflammation and tenderness over the past few weeks, rupturing and draining over the past few days.  Noted some thicker drainage at first but now is clear or slightly bloody when present.  Very scant.  Patient denies fever, chills, body aches.  Denies malaise or fatigue.  Has been trying to keep the skin clean and dry.  Taking ibuprofen for pain.  Has an appointment with dermatology but cannot get in for another couple of months.  Is currently working from home and is scheduled to do so until 2022 per her company.  Notes quite a bit issue with sitting for 8 hours at a time, especially with current flareup of gluteal hidradenitis.  Notes this makes things quite difficult.  Is wondering if she could potentially have a work accommodation to help with this.  ROS:   See pertinent positives and negatives per HPI.  Patient Active Problem List   Diagnosis Date Noted  . Iron deficiency anemia 06/25/2017  . Nasal congestion 04/03/2015  . Elevated BP 04/03/2015  . Hidradenitis suppurativa 08/07/2012    Social History   Tobacco Use  . Smoking status: Never Smoker  . Smokeless tobacco: Never Used  Substance Use Topics  . Alcohol use: No   Alcohol/week: 0.0 standard drinks    Current Outpatient Medications:  .  gabapentin (NEURONTIN) 100 MG capsule, Take 1 capsule (100 mg total) by mouth 2 (two) times daily., Disp: 180 capsule, Rfl: 0 .  ibuprofen (ADVIL,MOTRIN) 800 MG tablet, Take 1 tablet (800 mg total) by mouth every 8 (eight) hours as needed., Disp: 30 tablet, Rfl: 0 .  lidocaine (XYLOCAINE) 5 % ointment, Apply 1 application topically as needed., Disp: 35.44 g, Rfl: 0 .  traMADol (ULTRAM) 50 MG tablet, Take 1 tablet (50 mg total) by mouth every 8 (eight) hours as needed., Disp: 30 tablet, Rfl: 0 .  valACYclovir (VALTREX) 1000 MG tablet, TAKE ONE TABLET (1,000 MG DOSE) BY MOUTH 3 (THREE) TIMES A DAY FOR 7 DAYS., Disp: , Rfl: 0  Allergies  Allergen Reactions  . Penicillins Itching  . Sulfa Antibiotics Itching  . Hydrocodone Rash  . Oxycodone-Acetaminophen Itching    Objective:   There were no vitals taken for this visit.  Patient is well-developed, well-nourished in no acute distress.  Resting comfortably at home.  Head is normocephalic, atraumatic.  No labored breathing.  Speech is clear and coherent with logical content.  Patient is alert and oriented at baseline.      Assessment and Plan:   1. Hidradenitis suppurativa Current flare.  Focus in the gluteal region.  No continued purulent drainage.  Thankfully no alarm signs or symptoms.  Supportive measures reviewed with patient.  Rx ibuprofen 800 mg to sent to pharmacy to use for pain.  Alternate with OTC Tylenol.  Patient cannot tolerate stronger pain meds due to allergic reactions.  Will send in prescription for doxycycline for patient to begin if she notes increasing tenderness or recurrence of purulent drainage.  Strict in office assessment precautions reviewed with patient.  I agree that a work accommodation is reasonable.  We will restrict her to 6 hours max per day with breaks for standing throughout the workday if needed.  Encouraged her to call the  dermatologist to be put on a waiting list. - ibuprofen (ADVIL) 800 MG tablet; Take 1 tablet (800 mg total) by mouth every 8 (eight) hours as needed.  Dispense: 60 tablet; Refill: 0    Leeanne Rio, [place] 02/11/2019

## 2019-02-13 ENCOUNTER — Telehealth: Payer: Self-pay | Admitting: Physician Assistant

## 2019-02-13 NOTE — Telephone Encounter (Signed)
FMLA paperwork in your folder for review and completion

## 2019-02-13 NOTE — Telephone Encounter (Signed)
Paperwork faxed back to [employer]

## 2019-02-13 NOTE — Telephone Encounter (Signed)
Forms completed. Ready for fax. Given to [place] for faxing.

## 2019-02-13 NOTE — Telephone Encounter (Signed)
I have placed FMLA forms in the bin up front with a charge sheet. Pt has sent a mychart message to discuss this reduction in hours.

## 2019-03-19 ENCOUNTER — Encounter: Payer: Self-pay | Admitting: Physician Assistant

## 2019-07-01 ENCOUNTER — Encounter: Payer: Self-pay | Admitting: Physician Assistant

## 2019-07-15 ENCOUNTER — Encounter: Payer: Self-pay | Admitting: Physician Assistant

## 2019-07-16 ENCOUNTER — Encounter: Payer: Self-pay | Admitting: Physician Assistant

## 2019-07-22 ENCOUNTER — Encounter: Payer: Self-pay | Admitting: Physician Assistant

## 2019-07-22 NOTE — Telephone Encounter (Signed)
Paperwork completed and sent in as previously noted. Has already been sent to scan at the end of last week.

## 2019-08-16 ENCOUNTER — Other Ambulatory Visit: Payer: Self-pay

## 2019-08-16 ENCOUNTER — Encounter: Payer: Self-pay | Admitting: Physician Assistant

## 2019-08-16 ENCOUNTER — Telehealth (INDEPENDENT_AMBULATORY_CARE_PROVIDER_SITE_OTHER): Payer: BC Managed Care – PPO | Admitting: Physician Assistant

## 2019-08-16 VITALS — Ht 65.0 in | Wt 171.0 lb

## 2019-08-16 DIAGNOSIS — B9689 Other specified bacterial agents as the cause of diseases classified elsewhere: Secondary | ICD-10-CM | POA: Diagnosis not present

## 2019-08-16 DIAGNOSIS — J019 Acute sinusitis, unspecified: Secondary | ICD-10-CM | POA: Diagnosis not present

## 2019-08-16 MED ORDER — DOXYCYCLINE HYCLATE 100 MG PO CAPS: 100.0000 mg | ORAL_CAPSULE | Freq: Two times a day (BID) | ORAL | 0 refills | Status: DC

## 2019-08-16 NOTE — Progress Notes (Signed)
° °  Virtual Visit via Video   I connected with patient on 08/16/19 at  1:30 PM EDT by a video enabled telemedicine application and verified that I am speaking with the correct person using two identifiers.  Location patient: Home Location provider: [facility], Office Persons participating in the virtual visit: Patient, Provider, [place] (Patina Moore)  I discussed the limitations of evaluation and management by telemedicine and the availability of in person appointments. The patient expressed understanding and agreed to proceed.  Subjective:   HPI:   Patient presents via [facility] today c/o 5 days of progressively worsening nasal congestion with PND and voice hoarseness.  Has been having sinus pressure and headache, now with sinus pain and right-sided maxillary pain.  Denies fever, chills, chest congestion, chest pain or shortness of breath.  Has been keeping well hydrated and trying to rest.  Has been using a saline nasal rinse and taking daily Allegra.  Just started Mucinex today.  Denies any recent travel or sick contact.  Denies loss of taste or smell.  ROS:   See pertinent positives and negatives per HPI.  Patient Active Problem List   Diagnosis Date Noted   Iron deficiency anemia 06/25/2017   Nasal congestion 04/03/2015   Elevated BP 04/03/2015   Hidradenitis suppurativa 08/07/2012    Social History   Tobacco Use   Smoking status: Never Smoker   Smokeless tobacco: Never Used  Substance Use Topics   Alcohol use: No    Alcohol/week: 0.0 standard drinks    Current Outpatient Medications:    ibuprofen (ADVIL) 800 MG tablet, Take 1 tablet (800 mg total) by mouth every 8 (eight) hours as needed., Disp: 60 tablet, Rfl: 0   doxycycline (VIBRAMYCIN) 100 MG capsule, Take 1 capsule (100 mg total) by mouth 2 (two) times daily., Disp: 20 capsule, Rfl: 0  Allergies  Allergen Reactions   Penicillins Itching   Sulfa Antibiotics Itching   Hydrocodone Rash    Oxycodone-Acetaminophen Itching    Objective:   Ht 5\' 5"  (1.651 m)    Wt 171 lb (77.6 kg)    BMI 28.46 kg/m   Patient is well-developed, well-nourished in no acute distress.  Resting comfortably at home.  Head is normocephalic, atraumatic.  No labored breathing.  Speech is clear and coherent with logical content.  Patient is alert and oriented at baseline.   Assessment and Plan:   1. Acute bacterial sinusitis Rx Doxycycline.  Increase fluids.  Rest.  Saline nasal spray.  Probiotic.  Mucinex as directed.  Humidifier in bedroom. Restart Flonase. Continue Allegra.  Call or return to clinic if symptoms are not improving.  - doxycycline (VIBRAMYCIN) 100 MG capsule; Take 1 capsule (100 mg total) by mouth 2 (two) times daily.  Dispense: 20 capsule; Refill: 0    Leeanne Rio, [place] 08/16/2019

## 2019-08-21 ENCOUNTER — Telehealth: Payer: Self-pay | Admitting: Physician Assistant

## 2019-08-21 NOTE — Telephone Encounter (Signed)
Placed leave form in Alice Tucker's folder up front -

## 2019-08-22 NOTE — Telephone Encounter (Signed)
Paperwork completed and faxed.  °

## 2019-08-28 ENCOUNTER — Encounter: Payer: Self-pay | Admitting: Physician Assistant

## 2019-10-30 DIAGNOSIS — L249 Irritant contact dermatitis, unspecified cause: Secondary | ICD-10-CM | POA: Insufficient documentation

## 2019-11-26 ENCOUNTER — Encounter: Payer: Self-pay | Admitting: Physician Assistant

## 2019-11-26 ENCOUNTER — Telehealth (HOSPITAL_COMMUNITY): Payer: Self-pay | Admitting: Nurse Practitioner

## 2019-11-26 ENCOUNTER — Encounter: Payer: Self-pay | Admitting: Nurse Practitioner

## 2019-11-26 ENCOUNTER — Other Ambulatory Visit: Payer: Self-pay

## 2019-11-26 ENCOUNTER — Telehealth (INDEPENDENT_AMBULATORY_CARE_PROVIDER_SITE_OTHER): Payer: BC Managed Care – PPO | Admitting: Physician Assistant

## 2019-11-26 DIAGNOSIS — L732 Hidradenitis suppurativa: Secondary | ICD-10-CM

## 2019-11-26 DIAGNOSIS — U071 COVID-19: Secondary | ICD-10-CM

## 2019-11-26 MED ORDER — ALBUTEROL SULFATE HFA 108 (90 BASE) MCG/ACT IN AERS: 2.0000 | INHALATION_SPRAY | Freq: Four times a day (QID) | RESPIRATORY_TRACT | 0 refills | Status: DC | PRN

## 2019-11-26 MED ORDER — BENZONATATE 100 MG PO CAPS: 100.0000 mg | ORAL_CAPSULE | Freq: Three times a day (TID) | ORAL | 0 refills | Status: AC | PRN

## 2019-11-26 MED ORDER — IBUPROFEN 800 MG PO TABS: 800.0000 mg | ORAL_TABLET | Freq: Three times a day (TID) | ORAL | 0 refills | Status: AC | PRN

## 2019-11-26 MED ORDER — VITAMIN D3 25 MCG (1000 UT) PO CHEW: 25.0000 ug | CHEWABLE_TABLET | Freq: Every day | ORAL | 0 refills | Status: AC

## 2019-11-26 MED ORDER — ACETAMINOPHEN 500 MG PO TABS: 500.0000 mg | ORAL_TABLET | Freq: Four times a day (QID) | ORAL | 0 refills | Status: AC | PRN

## 2019-11-26 NOTE — Progress Notes (Signed)
I have discussed the procedure for the virtual visit with the patient who has given consent to proceed with assessment and treatment.   Lucee Brissett S Gustav Knueppel, CMA     

## 2019-11-26 NOTE — Telephone Encounter (Signed)
Called to Discuss with patient about Covid symptoms and the use of regeneron, a monoclonal antibody infusion for those with mild to moderate Covid symptoms and at a high risk of hospitalization.     Pt is qualified for this infusion at the [facility] infusion center due to co-morbid conditions and/or a member of an at-risk group.     Unable to reach pt. Left message to return call. Sent mychart message.   Ady Heimann, DNP, AGNP-C [phone] (Infusion Center Hotline)  

## 2019-11-26 NOTE — Progress Notes (Signed)
   Virtual Visit via Video   I connected with patient on 11/26/19 at  1:30 PM EDT by a video enabled telemedicine application and verified that I am speaking with the correct person using two identifiers.  Location patient: Home Location provider: [facility], Office Persons participating in the virtual visit: Patient, Provider, [place] (Patina Moore)  I discussed the limitations of evaluation and management by telemedicine and the availability of in person appointments. The patient expressed understanding and agreed to proceed.  Subjective:   HPI:   Patient presents via [place] today to discuss COVID symptoms. Patient notes being tested last week (positive) with symptoms starting Wednesday. Notes fever (Tmax 101) with chills and body aches. Notes cough and nasal congestion. Some nausea without emesis. Notes loose stool. Loss of smell. Denies sinus pain, ear pain or tooth pain. Denies chest pain, wheezing or SOB at rest. Does note mild SOBOE with O2 decreasing to 92-93%. Improved today. Is taking Tylenol, Ibuprofen, Vit D, Vit C and zinc. Has history of asthma without symptoms in recent years.    ROS:   See pertinent positives and negatives per HPI.  Patient Active Problem List   Diagnosis Date Noted  . Irritant dermatitis 10/30/2019  . Iron deficiency anemia 06/25/2017  . Nasal congestion 04/03/2015  . Elevated BP 04/03/2015  . Hidradenitis suppurativa 08/07/2012    Social History   Tobacco Use  . Smoking status: Never Smoker  . Smokeless tobacco: Never Used  Substance Use Topics  . Alcohol use: No    Alcohol/week: 0.0 standard drinks    Current Outpatient Medications:  .  ibuprofen (ADVIL) 800 MG tablet, Take 1 tablet (800 mg total) by mouth every 8 (eight) hours as needed., Disp: 60 tablet, Rfl: 0 .  spironolactone (ALDACTONE) 100 MG tablet, Take 100 mg by mouth daily. (Patient not taking: Reported on 11/26/2019), Disp: , Rfl:   Allergies  Allergen Reactions  .  Penicillins Itching  . Sulfa Antibiotics Itching  . Hydrocodone Rash  . Oxycodone-Acetaminophen Itching    Objective:   There were no vitals taken for this visit.  Patient is well-developed, well-nourished in no acute distress.  Resting comfortably on bed at home.  Head is normocephalic, atraumatic.  No labored breathing.  Speech is clear and coherent with logical content.  Patient is alert and oriented at baseline.   Assessment and Plan:   1. COVID-19 + test. Classic symptoms. Thankfully with her history there is no asthma exacerbation present. Will have her continue Vitamin regimen, alternating tylenol and ibuprofen. Will add on albuterol and tessalon. Will also reach out to infusion center to get her set up for monoclonal antibody infusion. Patient enrolled in [place] mychart monitoring program to complete daily. Strict ER precautions reviewed with patient.  - MyChart COVID-19 home monitoring program; Future - Temperature monitoring; Future    Leeanne Rio, PA-C 11/26/2019

## 2019-11-26 NOTE — Patient Instructions (Signed)
Instructions sent to MyChart

## 2019-11-27 ENCOUNTER — Encounter (INDEPENDENT_AMBULATORY_CARE_PROVIDER_SITE_OTHER): Payer: Self-pay

## 2019-11-28 ENCOUNTER — Telehealth: Payer: Self-pay | Admitting: *Deleted

## 2019-11-28 NOTE — Telephone Encounter (Signed)
Call to patient to get status of fever: Patient reports she has had fever since Friday.patient states her temperature has been going up at night and back down during the day, She states she thinks she may have "passed out" last night with her high temperature- she states she feels like her thoughts are scattered. Patient reports she lives alone. She states she has been hydrating but is not sure how much water she is getting in.She reports using Ibuprofen 800 mg and Tylenol cold and flu for her symptoms. Her present temperature is 99 and her O2 sat is 92. Advised patient to continue to treat her symptoms as instructed by PCP. Advised her to reach out to office today to report her status and see if her provider wants to change her management in any way. She states she will do that. Also relayed number for infusion clinic so she can return the call to them.

## 2019-12-15 ENCOUNTER — Other Ambulatory Visit: Payer: Self-pay | Admitting: Physician Assistant
